# Patient Record
Sex: Male | Born: 1955 | Race: White | Hispanic: No | Marital: Married | Smoking: Current every day smoker
Health system: Southern US, Community
[De-identification: ages and names within clinical notes are randomized; demographics above are authoritative.]

## PROBLEM LIST (undated history)

## (undated) DIAGNOSIS — R918 Other nonspecific abnormal finding of lung field: Secondary | ICD-10-CM

## (undated) DIAGNOSIS — J449 Chronic obstructive pulmonary disease, unspecified: Secondary | ICD-10-CM

## (undated) DIAGNOSIS — J439 Emphysema, unspecified: Secondary | ICD-10-CM

## (undated) HISTORY — DX: Other nonspecific abnormal finding of lung field: R91.8

## (undated) HISTORY — DX: Chronic obstructive pulmonary disease, unspecified: J44.9

## (undated) HISTORY — DX: Emphysema, unspecified: J43.9

---

## 1991-11-04 HISTORY — PX: HERNIA REPAIR: SHX51

## 2012-09-08 LAB — PULMONARY FUNCTION TEST

## 2014-04-18 DIAGNOSIS — R918 Other nonspecific abnormal finding of lung field: Secondary | ICD-10-CM | POA: Insufficient documentation

## 2014-05-23 ENCOUNTER — Encounter: Payer: Self-pay | Admitting: *Deleted

## 2014-05-23 DIAGNOSIS — R918 Other nonspecific abnormal finding of lung field: Secondary | ICD-10-CM

## 2014-05-23 DIAGNOSIS — G4710 Hypersomnia, unspecified: Secondary | ICD-10-CM

## 2014-05-23 DIAGNOSIS — J449 Chronic obstructive pulmonary disease, unspecified: Secondary | ICD-10-CM | POA: Insufficient documentation

## 2014-05-23 DIAGNOSIS — G4730 Sleep apnea, unspecified: Secondary | ICD-10-CM

## 2014-05-23 DIAGNOSIS — J441 Chronic obstructive pulmonary disease with (acute) exacerbation: Secondary | ICD-10-CM

## 2014-05-23 DIAGNOSIS — J439 Emphysema, unspecified: Secondary | ICD-10-CM | POA: Insufficient documentation

## 2014-05-23 DIAGNOSIS — F17200 Nicotine dependence, unspecified, uncomplicated: Secondary | ICD-10-CM | POA: Insufficient documentation

## 2014-05-23 DIAGNOSIS — F419 Anxiety disorder, unspecified: Secondary | ICD-10-CM | POA: Insufficient documentation

## 2014-05-23 DIAGNOSIS — R0600 Dyspnea, unspecified: Secondary | ICD-10-CM | POA: Insufficient documentation

## 2014-05-23 DIAGNOSIS — J310 Chronic rhinitis: Secondary | ICD-10-CM | POA: Insufficient documentation

## 2014-05-24 ENCOUNTER — Encounter: Payer: 59 | Admitting: Thoracic Surgery (Cardiothoracic Vascular Surgery)

## 2014-05-26 ENCOUNTER — Encounter: Payer: 59 | Admitting: Thoracic Surgery (Cardiothoracic Vascular Surgery)

## 2014-06-06 ENCOUNTER — Other Ambulatory Visit: Payer: Self-pay | Admitting: *Deleted

## 2014-06-06 ENCOUNTER — Encounter: Payer: Self-pay | Admitting: Thoracic Surgery (Cardiothoracic Vascular Surgery)

## 2014-06-06 ENCOUNTER — Institutional Professional Consult (permissible substitution) (INDEPENDENT_AMBULATORY_CARE_PROVIDER_SITE_OTHER): Payer: 59 | Admitting: Thoracic Surgery (Cardiothoracic Vascular Surgery)

## 2014-06-06 VITALS — BP 198/94 | HR 80 | Ht 71.0 in | Wt 167.0 lb

## 2014-06-06 DIAGNOSIS — R918 Other nonspecific abnormal finding of lung field: Secondary | ICD-10-CM

## 2014-06-06 DIAGNOSIS — R222 Localized swelling, mass and lump, trunk: Secondary | ICD-10-CM

## 2014-06-06 NOTE — Progress Notes (Signed)
PCP is Renae Fickle, MD Referring Provider is Marcellus Scott, MD  Chief Complaint  Patient presents with  . New Thoracic    Lung Mass/Surg Consult    HPI:57 yo smoker sent for evaluation of left apical lung mass/ consolidation  Aaron Schroeder is a 58 year old man with a 90-pack-year history of smoking (2-3 packs per day since age 33), COPD, and hypertension. Back in January he was ill. He was more short of breath and felt very weak and had general malaise. He had a chest x-ray at that time which showed "pneumonia." He was treated with antibiotics (Zithromax) and his symptoms improved. He says that he did fine through this spring, however in June when it got hot he started having trouble breathing. He says this happens every year during the summer. He went back for a 3 month checkup. A CT of the chest was done which showed an opacity at the left apex. A PET CT then was done in mid June. It confirmed the mass at the left apex, there was mild hypermetabolic activity within SUV of 3.3. There was no hilar or mediastinal adenopathy. He essentially had a repeat CT in mid July, which showed no change in the mass/consolidation. There was no hilar or mediastinal adenopathy.  He says that he has cough every day. Mucous is typically clear. He hasn't had any gross hemoptysis but thinks he made seen some small specks of blood sometimes after his been coughing very hard. He says he is short of breath when he wakes up in the morning. He has trouble with the heat. He does have frequent wheezing. He says he is able to climb a flight of stairs but has to do so slowly. In general he is okay with walking on level ground was he has to walk quickly. He has not had any recent fevers chills or sweats. He's lost about 5 pounds over the past 6 months and about 3 pounds over the past 3 months. He denies any chest pain, pressure, or tightness.  ECOG/ZUBROD= 1   Past Medical History  Diagnosis Date  . COPD (chronic obstructive  pulmonary disease)   . Emphysema of lung   . Lung mass     Past Surgical History  Procedure Laterality Date  . Hernia repair  1993    Family History  Problem Relation Age of Onset  . Cancer Maternal Aunt     breast  . Cancer Paternal Uncle     prostate  . Cancer Paternal Grandfather     prostate    Social History History  Substance Use Topics  . Smoking status: Current Every Day Smoker -- 3.00 packs/day for 30 years    Types: Cigarettes    Start date: 05/23/1973  . Smokeless tobacco: Not on file     Comment: down to 1 ppd x 6 months  . Alcohol Use: Yes     Comment: "moderate"    Current Outpatient Prescriptions  Medication Sig Dispense Refill  . albuterol (PROVENTIL HFA;VENTOLIN HFA) 108 (90 BASE) MCG/ACT inhaler Inhale 2 puffs into the lungs every 6 (six) hours as needed for wheezing or shortness of breath.      . ALPRAZolam (XANAX) 0.5 MG tablet Take 0.5 mg by mouth 3 (three) times daily.      Marland Kitchen aspirin EC 81 MG tablet Take 81 mg by mouth daily.      . Fluticasone-Salmeterol (ADVAIR) 500-50 MCG/DOSE AEPB Inhale 1 puff into the lungs 2 (two) times daily.      Marland Kitchen  ipratropium-albuterol (DUONEB) 0.5-2.5 (3) MG/3ML SOLN Take 3 mLs by nebulization every 6 (six) hours as needed.      . metoprolol succinate (TOPROL-XL) 50 MG 24 hr tablet Take 50 mg by mouth daily. Take with or immediately following a meal.      . mometasone (NASONEX) 50 MCG/ACT nasal spray Place 2 sprays into the nose at bedtime.      . Olopatadine HCl (PATANASE) 0.6 % SOLN Place 2 drops into the nose daily. In each nares      . omeprazole (PRILOSEC) 40 MG capsule Take 40 mg by mouth daily.      . predniSONE (DELTASONE) 10 MG tablet Take 10 mg by mouth daily with breakfast.      . tiotropium (SPIRIVA) 18 MCG inhalation capsule Place 18 mcg into inhaler and inhale daily.       No current facility-administered medications for this visit.    Allergies  Allergen Reactions  . Codeine Other (See Comments)    Pt  states he feels ill after taking this.    Review of Systems  Constitutional: Positive for activity change and unexpected weight change (lost 5 pounds in 6 months, 3 pounds in 3 months). Negative for fever and chills.  Respiratory: Positive for cough (productive- clear, ? small amounts of blood a few weeks ago), shortness of breath and wheezing.   Cardiovascular: Negative for chest pain and leg swelling.       Heart murmur  Musculoskeletal: Positive for myalgias.       Leg cramps  Neurological: Negative.   Hematological: Negative for adenopathy. Bruises/bleeds easily.  Psychiatric/Behavioral: The patient is nervous/anxious.   All other systems reviewed and are negative.   BP 198/94  Pulse 80  Ht 5\' 11"  (1.803 m)  Wt 167 lb (75.751 kg)  BMI 23.30 kg/m2  SpO2 98% Physical Exam  Vitals reviewed. Constitutional: He is oriented to person, place, and time. He appears well-developed and well-nourished. No distress.  HENT:  Head: Normocephalic and atraumatic.  Eyes: EOM are normal. Pupils are equal, round, and reactive to light.  Neck: Neck supple. No thyromegaly present.  + right carotid bruit  Cardiovascular:  Murmur (3/6 high pitched systolic murmur at apex) heard. Pulmonary/Chest: Effort normal. No respiratory distress. He has wheezes (faint bilaterally).  Abdominal: Soft. There is no tenderness.  Musculoskeletal: He exhibits no edema.  Lymphadenopathy:    He has no cervical adenopathy.  Neurological: He is alert and oriented to person, place, and time. No cranial nerve deficit.  Skin: Skin is warm and dry.     Diagnostic Tests:   Impression: 58 year old gentleman with a long history of tobacco abuse and COPD who has a left apical opacity. A PET CT about 6 weeks ago showed mild hypermetabolic activity within SUV of 3.3. A followup CT scan showed no change in the left apical abnormality. There is no hilar or mediastinal adenopathy.  Had a long discussion with Mr. and Mrs.  Schroeder regarding the differential diagnosis. We reviewed the films. Looking at it carefully there is an opacity in the setting of surrounding bullous emphysematous disease. There are air bronchograms within the opacity. Given his clinical history and the appearance of the CT scan this, I suspect this is an area of infection/inflammation, possibly BOOP. I cannot rule out the possibility that it is a malignancy, but I do think that is less likely.  We also discussed the possible options for workup. 1. Continued radiographic followup 2. CT-guided biopsy-I don't think this  is feasible given the surrounding bullous emphysematous disease 3. Navigational bronchoscopy for biopsy-while feasible I think it is still relatively high risk of pneumothorax. 4. Surgical resection-follow this would definitively diagnose and, potentially, definitively treat the lesion he would have significant surgical risk. He would need an echocardiogram due to his very prominent heart murmur. And he would also need carotid duplex do a loud high-pitched bruit on the right side prior to surgery.  After lengthy discussion, Aaron Schroeder wishes to continue to follow this radiographically. I will plan to see him back in 6 weeks with repeat CT of the chest (that'll be 8 weeks from the previous scan)   Plan: Return in 6 weeks CT chest

## 2014-07-17 ENCOUNTER — Encounter: Payer: Self-pay | Admitting: *Deleted

## 2014-07-18 ENCOUNTER — Other Ambulatory Visit: Payer: Medicare Other

## 2014-07-18 ENCOUNTER — Ambulatory Visit: Payer: 59 | Admitting: Thoracic Surgery (Cardiothoracic Vascular Surgery)

## 2014-07-25 ENCOUNTER — Encounter: Payer: Self-pay | Admitting: Thoracic Surgery (Cardiothoracic Vascular Surgery)

## 2014-07-25 ENCOUNTER — Ambulatory Visit (INDEPENDENT_AMBULATORY_CARE_PROVIDER_SITE_OTHER): Payer: 59 | Admitting: Thoracic Surgery (Cardiothoracic Vascular Surgery)

## 2014-07-25 ENCOUNTER — Ambulatory Visit
Admission: RE | Admit: 2014-07-25 | Discharge: 2014-07-25 | Disposition: A | Discharge status: Home / Self Care | Payer: 59 | Source: Ambulatory Visit | Attending: Thoracic Surgery (Cardiothoracic Vascular Surgery) | Admitting: Thoracic Surgery (Cardiothoracic Vascular Surgery)

## 2014-07-25 VITALS — BP 139/77 | HR 81 | Ht 71.0 in | Wt 167.0 lb

## 2014-07-25 DIAGNOSIS — R918 Other nonspecific abnormal finding of lung field: Secondary | ICD-10-CM

## 2014-07-25 DIAGNOSIS — R222 Localized swelling, mass and lump, trunk: Secondary | ICD-10-CM

## 2014-07-25 NOTE — Progress Notes (Signed)
HPI:  Aaron Schroeder returns today to followup regarding his left apical abnormality.  Aaron Schroeder is a 58 year old man with a 90-pack-year history of smoking (2-3 packs per day since age 18), COPD, and hypertension. Back in January he was ill. He was more short of breath and felt very weak and had general malaise. He had a chest x-ray at that time which showed "pneumonia." He was treated with antibiotics (Zithromax) and his symptoms improved. He says that he did fine through this spring, however in June when it got hot he started having trouble breathing. He says this happens every year during the summer. He went back for a 3 month checkup. A CT of the chest was done which showed an opacity at the left apex. A PET CT then was done in mid June. It confirmed the mass at the left apex, there was mild hypermetabolic activity within SUV of 3.3. There was no hilar or mediastinal adenopathy. He essentially had a repeat CT in mid July, which showed no change in the mass/consolidation. There was no hilar or mediastinal adenopathy.   After discussion of our options including continued radiographic followup, bronchoscopy, CT-guided biopsy, and surgical resection, Aaron Schroeder opted to follow this radiographically.  He continues to smoke. He says his been using Vapor cigarettes mostly but is still smoking a regular cigarettes as well. He has a chronic cough which is unchanged. He does still have episodes of wheezing. His weight has been stable since his last visit.  He mentioned almost as an aside that he's been having some chest pain. He says this usually happens when he is anxious about something or when he is exerting himself. H thinks it is just heartburn. He describes this as a substernal burning pain. He has not had any at rest or causing him to wake up in the night.  Past Medical History  Diagnosis Date  . COPD (chronic obstructive pulmonary disease)   . Emphysema of lung   . Lung mass      Current Outpatient  Prescriptions  Medication Sig Dispense Refill  . albuterol (PROVENTIL HFA;VENTOLIN HFA) 108 (90 BASE) MCG/ACT inhaler Inhale 2 puffs into the lungs every 6 (six) hours as needed for wheezing or shortness of breath.      Marland Kitchen aspirin EC 81 MG tablet Take 81 mg by mouth daily.      . Fluticasone-Salmeterol (ADVAIR) 500-50 MCG/DOSE AEPB Inhale 1 puff into the lungs 2 (two) times daily.      Marland Kitchen ipratropium-albuterol (DUONEB) 0.5-2.5 (3) MG/3ML SOLN Take 3 mLs by nebulization every 6 (six) hours as needed.      . metoprolol succinate (TOPROL-XL) 50 MG 24 hr tablet Take 50 mg by mouth daily. Take with or immediately following a meal.      . mometasone (NASONEX) 50 MCG/ACT nasal spray Place 2 sprays into the nose at bedtime.      . Olopatadine HCl (PATANASE) 0.6 % SOLN Place 2 drops into the nose daily. In each nares      . omeprazole (PRILOSEC) 40 MG capsule Take 40 mg by mouth daily.      . predniSONE (DELTASONE) 10 MG tablet Take 10 mg by mouth daily with breakfast.      . tiotropium (SPIRIVA) 18 MCG inhalation capsule Place 18 mcg into inhaler and inhale daily.      Marland Kitchen ALPRAZolam (XANAX) 0.5 MG tablet Take 0.5 mg by mouth 3 (three) times daily.       No current facility-administered  medications for this visit.    Physical Exam BP 139/77  Pulse 81  Ht  (1.803 m)  Wt 167 lb (75.751 kg)  BMI 23.30 kg/m2  SpO62 99% 58 year old man in no acute distress No cervical or subclavicular adenopathy Cardiac regular rate and rhythm normal S1 and S2 Lungs diminished breath sounds bilaterally, no wheezing  Diagnostic Tests: CT CHEST WITHOUT CONTRAST  TECHNIQUE:  Multidetector CT imaging of the chest was performed following the  standard protocol without IV contrast.  COMPARISON: Chest CT 05/16/2014.  FINDINGS:  Mediastinum: Heart size is normal. There is no significant  pericardial fluid, thickening or pericardial calcification. There is  atherosclerosis of the thoracic aorta, the great vessels of  the  mediastinum and the coronary arteries, including calcified  atherosclerotic plaque in the left main, left anterior descending,  left circumflex and right coronary arteries. Extensive  calcifications of the aortic valve. No pathologically enlarged  mediastinal or hilar lymph nodes. Please note that accurate  exclusion of hilar adenopathy is limited on noncontrast CT scans.  Esophagus is unremarkable in appearance.  Lungs/Pleura: Again noted is a somewhat masslike appearing area of  chronic left apical pleuroparenchymal thickening and architectural  distortion which is similar to prior exams dating back to  04/10/2014, most compatible with an area of chronic post infectious  or inflammatory scarring. In the remainder of the lungs there are no  definite suspicious appearing pulmonary nodules or masses. No acute  consolidative airspace disease. No pleural effusions. There is  extensive chronic pleural thickening bilaterally, with bandlike  areas of atelectasis in the posterior aspects of the lower lobes of  the lungs bilaterally which are unchanged. Moderate paraseptal and  mild centrilobular emphysema. Mild diffuse bronchial wall  thickening.  Upper Abdomen: Unremarkable.  Musculoskeletal: There are no aggressive appearing lytic or blastic  lesions noted in the visualized portions of the skeleton.  IMPRESSION:  1. Stable examination again demonstrating chronic masslike  pleuroparenchymal thickening in the left apex which is most  compatible with an area of chronic post infectious or inflammatory  scarring.  2. Mild diffuse bronchial wall thickening with moderate paraseptal  and mild centrilobular emphysema.  3. Atherosclerosis, including left main and 3 vessel coronary artery  disease. Please note that although the presence of coronary artery  calcium documents the presence of coronary artery disease, the  severity of this disease and any potential stenosis cannot be  assessed on  this non-gated CT examination. Assessment for potential  risk factor modification, dietary therapy or pharmacologic therapy  may be warranted, if clinically indicated.  4. Extensive bilateral posterior pleural thickening and bandlike  areas of chronic atelectasis in the a lower lobes of the lungs  bilaterally.  Electronically Signed  By: Trudie Reed M.D.  On: 07/25/2014 15:27    Impression: Mr. Aaron Schroeder is a 57 year old gentleman with a history of heavy tobacco abuse who has an unusual opacity in his left apex. This was mildly hypermetabolic on PET CT back in June. It has not changed in the interim over the past 3 months. Given the stability in addition to the high risk nature of attempting to biopsy this lesion, I recommended continued radiographic followup. He is in agreement with that plan.  Of more immediate concern is his substernal chest pain. His CT scans do show extensive atherosclerosis in his coronary arteries. His symptoms are consistent with angina. I told in no uncertain terms talk with Dr. Samuel Germany about this to be evaluated for possible coronary disease.  I again counseled him regarding smoking cessation and its importance from both the pulmonary and cardiac perspective  Plan: Patient will talk to Dr. Samuel Germany about his chest pain to see if cardiology consultation is appropriate  I will see him back in 3 months with a repeat CT of the chest

## 2014-09-11 ENCOUNTER — Other Ambulatory Visit: Payer: Self-pay | Admitting: *Deleted

## 2014-09-11 DIAGNOSIS — R918 Other nonspecific abnormal finding of lung field: Secondary | ICD-10-CM

## 2014-10-18 ENCOUNTER — Encounter: Payer: Self-pay | Admitting: *Deleted

## 2014-10-24 ENCOUNTER — Encounter: Payer: Self-pay | Admitting: Thoracic Surgery (Cardiothoracic Vascular Surgery)

## 2014-10-24 ENCOUNTER — Ambulatory Visit (INDEPENDENT_AMBULATORY_CARE_PROVIDER_SITE_OTHER): Payer: 59 | Admitting: Thoracic Surgery (Cardiothoracic Vascular Surgery)

## 2014-10-24 ENCOUNTER — Ambulatory Visit
Admission: RE | Admit: 2014-10-24 | Discharge: 2014-10-24 | Disposition: A | Discharge status: Home / Self Care | Payer: 59 | Source: Ambulatory Visit | Attending: Thoracic Surgery (Cardiothoracic Vascular Surgery) | Admitting: Thoracic Surgery (Cardiothoracic Vascular Surgery)

## 2014-10-24 VITALS — BP 150/80 | HR 80 | Resp 20 | Ht 71.0 in | Wt 168.0 lb

## 2014-10-24 DIAGNOSIS — R918 Other nonspecific abnormal finding of lung field: Secondary | ICD-10-CM

## 2014-10-24 NOTE — Progress Notes (Signed)
HPI:  [name] is a 58 yo man with a history of heavy tobacco abuse and COPD who we have been following for a "mass" in the left apex.  Mr. Aaron Schroeder is a 58 year old man with a 90-pack-year history of smoking (2-3 packs per day since age 58), COPD, and hypertension. He had pneumonia in January. Then in June when it got hot he started having trouble breathing. He says it happens every year during the summer. He went back for a 3 month checkup. A CT of the chest was done which showed an opacity at the left apex. A PET CT then was done in mid June. It confirmed the mass at the left apex, there was mild hypermetabolic activity within SUV of 3.3. There was no hilar or mediastinal adenopathy. He eventually had a repeat CT in mid July, which showed no change in the mass/consolidation. There was no hilar or mediastinal adenopathy.   I saw him in August. We discussed the options including continued radiographic followup, bronchoscopy, CT-guided biopsy, and surgical resection. Mr. Aaron Schroeder opted to follow this radiographically. I saw him again in September for a follow up visit. There was no change at that time.  He now returns for a 3 month follow up. He says his breathing has been "fine", except for a risk for infection last week which was treated with antibiotics. He continues to smoke. He is smoking primarily regular cigarettes rather than the vapor cigarettes. He says he smoking about a half a pack a day. His chronic cough is unchanged. He has not had any hemoptysis. He still has wheezing. At his wife says his lost between 7 and 10 pounds over the past several months. He denies any chest pain since his last visit.  Past Medical History  Diagnosis Date  . COPD (chronic obstructive pulmonary disease)   . Emphysema of lung   . Lung mass       Current Outpatient Prescriptions  Medication Sig Dispense Refill  . albuterol (PROVENTIL HFA;VENTOLIN HFA) 108 (90 BASE) MCG/ACT inhaler Inhale 2 puffs into the lungs  every 6 (six) hours as needed for wheezing or shortness of breath.    . ALPRAZolam (XANAX) 0.5 MG tablet Take 0.5 mg by mouth 3 (three) times daily.    Marland Kitchen. amoxicillin-clavulanate (AUGMENTIN) 875-125 MG per tablet Take 1 tablet by mouth 2 (two) times daily.     Marland Kitchen. aspirin EC 81 MG tablet Take 81 mg by mouth daily.    . Fluticasone-Salmeterol (ADVAIR) 500-50 MCG/DOSE AEPB Inhale 1 puff into the lungs 2 (two) times daily.    Marland Kitchen. ipratropium-albuterol (DUONEB) 0.5-2.5 (3) MG/3ML SOLN Take 3 mLs by nebulization every 6 (six) hours as needed.    . metoprolol succinate (TOPROL-XL) 50 MG 24 hr tablet Take 50 mg by mouth daily. Take with or immediately following a meal.    . mometasone (NASONEX) 50 MCG/ACT nasal spray Place 2 sprays into the nose at bedtime.    . Olopatadine HCl (PATANASE) 0.6 % SOLN Place 2 drops into the nose daily. In each nares    . omeprazole (PRILOSEC) 40 MG capsule Take 40 mg by mouth daily.    . predniSONE (DELTASONE) 10 MG tablet Take 10 mg by mouth daily with breakfast.    . tiotropium (SPIRIVA) 18 MCG inhalation capsule Place 18 mcg into inhaler and inhale daily.     No current facility-administered medications for this visit.    Physical Exam BP 150/80 mmHg  Pulse 80  Resp 20  Ht 5\' 11"  (1.803 m)  Wt 168 lb (76.204 kg)  BMI 23.44 kg/m2  SpO762 3598% 58 year old male in no acute distress Well-developed well-nourished Smell of tobacco on clothing Lungs with mild wheezing bilaterally No palpable cervical or subclavicular adenopathy Cardiac regular rate and rhythm with a 2/6 systolic murmur  Diagnostic Tests: CT CHEST WITHOUT CONTRAST  TECHNIQUE: Multidetector CT imaging of the chest was performed following the standard protocol without IV contrast.  COMPARISON: CT chest dated 07/25/2014 and 05/16/2014. PET-CT dated 04/18/2014.  FINDINGS: Moderate centrilobular and paraseptal emphysematous changes, most severe at the bilateral lung apices.  Stable left apical  mass-like opacity (series 4/ image 13), unchanged on recent prior studies (since at least 04/10/2014) although new/progressed from 2014, with mild/low grade hypermetabolism on PET. This appearance remains most compatible with left apical pleural parenchymal scarring, likely post infectious/inflammatory.  No new/suspicious pulmonary nodules. Mild scarring/ atelectasis at the lung bases. Trace bilateral pleural effusions. No pneumothorax.  Visualized thyroid is unremarkable.  The heart is normal in size. No pericardial effusion. Left main and three vessel coronary atherosclerosis. Atherosclerotic calcifications of the aortic arch. Ectasia of the ascending thoracic aorta, measuring 3.9 cm.  Small mediastinal lymph nodes which do not meet pathologic CT size criteria. No suspicious axillary lymphadenopathy.  Visualized upper abdomen is notable for vascular calcifications.  Mild degenerative changes of the visualized thoracolumbar spine.  IMPRESSION: Stable left apical mass-like opacity, unchanged since June 2015, likely reflecting post infectious/inflammatory pleural parenchymal scarring.  Underlying moderate emphysematous changes.   Electronically Signed  By: Charline BillsSriyesh Krishnan M.D.  On: 10/24/2014 12:55  Impression: 58 year old man with a left apical opacity/mass on CT scan. This is unchanged since his last CT back in September, which itself was unchanged from his CT in June. This most likely is an area of post inflammatory scarring or chronic inflammatory process such as BOOP. I reviewed the CT scan with him and his wife. They understand that there is no way that we can definitively rule out the possibility of cancer in that area.  I once again reviewed the options of surgical resection, biopsy, and continued radiographic follow-up. We again discussed the relative advantages and disadvantages of each of those approaches. He wishes to continue to follow this  radiographically.  I once again counseled him on the importance of smoking cessation. He says he is cutting back gradually, and does not need any assistance.  Plan: Return in 3 months with CT of chest

## 2015-01-11 ENCOUNTER — Other Ambulatory Visit: Payer: Self-pay

## 2015-01-11 DIAGNOSIS — R911 Solitary pulmonary nodule: Secondary | ICD-10-CM

## 2015-02-20 ENCOUNTER — Ambulatory Visit
Admission: RE | Admit: 2015-02-20 | Discharge: 2015-02-20 | Disposition: A | Discharge status: Home / Self Care | Payer: Medicare PPO | Source: Ambulatory Visit | Attending: Thoracic Surgery (Cardiothoracic Vascular Surgery) | Admitting: Thoracic Surgery (Cardiothoracic Vascular Surgery)

## 2015-02-20 ENCOUNTER — Encounter: Payer: Self-pay | Admitting: Thoracic Surgery (Cardiothoracic Vascular Surgery)

## 2015-02-20 ENCOUNTER — Ambulatory Visit (INDEPENDENT_AMBULATORY_CARE_PROVIDER_SITE_OTHER): Payer: Medicare PPO | Admitting: Thoracic Surgery (Cardiothoracic Vascular Surgery)

## 2015-02-20 VITALS — BP 141/72 | HR 94 | Resp 16 | Ht 71.0 in | Wt 168.0 lb

## 2015-02-20 DIAGNOSIS — R918 Other nonspecific abnormal finding of lung field: Secondary | ICD-10-CM | POA: Diagnosis not present

## 2015-02-20 DIAGNOSIS — R911 Solitary pulmonary nodule: Secondary | ICD-10-CM

## 2015-02-20 MED ORDER — AMOXICILLIN-POT CLAVULANATE 875-125 MG PO TABS: 1.0000 | ORAL_TABLET | Freq: Two times a day (BID) | ORAL | Status: AC

## 2015-02-20 NOTE — Progress Notes (Signed)
[address]       Aaron Schroeder [id number]             [phone]       HPI: Mr. Aaron Schroeder returns for a scheduled 3 month follow-up visit.  Mr Schroeder is a 59 yo man with a history of heavy tobacco abuse and COPD who I have been following since August 2015 for a "mass" in his left apex.  He has a 90-pack-year history of smoking (2-3 packs per day since age 32), COPD, and hypertension. He says he is currently down to about one half pack per day, but that is what he told me that his last visit as well.  He had pneumonia in January 2015. In June 2015 he started having trouble breathing. He went back for a 3 month checkup. A CT of the chest was done which showed an opacity at the left apex. A PET CT  was done in mid June. It confirmed the mass at the left apex, there was mild hypermetabolic activity with an SUV of 3.3. There was no hilar or mediastinal adenopathy. He eventually had a repeat CT in mid July, which showed no change in the mass/consolidation. There was no hilar or mediastinal adenopathy.   I first saw him in August 2015. We discussed the options including continued radiographic followup, bronchoscopy, CT-guided biopsy, and surgical resection. Mr. [name] opted to follow this radiographically. I saw him again in September and again in December. The radiographic findings were stable.  In the interim since his last visit he says been having more trouble with shortness of breath. As noted he continues to smoke. He complains of general malaise. He has had any fevers, chills, or sweats. However he has had increasingly productive cough with greenish sputum. He denies any hemoptysis.  Past Medical History  Diagnosis Date  . COPD (chronic obstructive pulmonary disease)   . Emphysema of lung   . Lung mass       Current Outpatient Prescriptions  Medication Sig Dispense Refill  . albuterol (PROVENTIL HFA;VENTOLIN HFA) 108 (90 BASE) MCG/ACT inhaler Inhale 2 puffs into the lungs  every 6 (six) hours as needed for wheezing or shortness of breath.    . ALPRAZolam (XANAX) 0.5 MG tablet Take 0.5 mg by mouth 3 (three) times daily.    Marland Kitchen aspirin EC 81 MG tablet Take 81 mg by mouth daily.    . Fluticasone-Salmeterol (ADVAIR) 500-50 MCG/DOSE AEPB Inhale 1 puff into the lungs 2 (two) times daily.    Marland Kitchen ipratropium-albuterol (DUONEB) 0.5-2.5 (3) MG/3ML SOLN Take 3 mLs by nebulization every 6 (six) hours as needed.    . metoprolol succinate (TOPROL-XL) 50 MG 24 hr tablet Take 50 mg by mouth daily. Take with or immediately following a meal.    . mometasone (NASONEX) 50 MCG/ACT nasal spray Place 2 sprays into the nose at bedtime.    . Olopatadine HCl (PATANASE) 0.6 % SOLN Place 2 drops into the nose daily. In each nares    . omeprazole (PRILOSEC) 40 MG capsule Take 40 mg by mouth daily.    . predniSONE (DELTASONE) 10 MG tablet Take 10 mg by mouth daily with breakfast.    . amoxicillin-clavulanate (AUGMENTIN) 875-125 MG per tablet Take 1 tablet by mouth 2 (two) times daily. 20 tablet 0   No current facility-administered medications for this visit.    Physical Exam BP 141/72 mmHg  Pulse 94  Resp 16  Ht  (  1.803 m)  Wt 168 lb (76.204 kg)  BMI 23.44 kg/m2  SpO12 10595% 59 year old man in no acute distress Obvious tobacco odor Alert and oriented 3 with no focal deficits No cervical or subclavicular adenopathy Lungs with diminished breath sounds bilaterally, no wheezing Cardiac regular rate and rhythm normal S1 and S2  Diagnostic Tests: CT CHEST WITHOUT CONTRAST  TECHNIQUE: Multidetector CT imaging of the chest was performed following the standard protocol without IV contrast.  COMPARISON: 10/24/2014, 07/25/2014, PET-CT 04/18/2014  FINDINGS: Mediastinum/Nodes: 4.0 cm ascending aortic dilatation image 34. Atheromatous coronary arterial and aortic plaque. No hilar or mediastinal lymphadenopathy. Heart size is normal.  Lungs/Pleura: Interval development of  cavitation with internal air-fluid levels within the previously seen left apical irregular consolidation with internal air bronchogram formation and central bronchial wall thickening. Curvilinear bibasilar scarring is stable. Underlying emphysematous changes reidentified. A few areas of patchy tree-in-bud type nodular airspace opacity have developed elsewhere within the left upper lobe and lingula, but the largest representative individual nodule measuring 6 mm image 34. No new focal finding is identified in the right lung.  Upper abdomen: Adrenal glands appear normal in their visualized aspects. Atheromatous aortic and branch vessel calcification.  Musculoskeletal: No acute osseous abnormality. Bones are subjectively osteopenic.  IMPRESSION: Interval development of cavitation with internal air-fluid levels within the previously seen left upper lobe apical pulmonary parenchymal consolidation. Areas of central bronchial wall thickening and tree-in-bud type nodular airspace disease in the left upper lobe and lingula are compatible overall with small airways infection and superimposed left upper lobe infection is most likely rather than cavitary neoplasm.  Underlying emphysema.  These results will be called to the ordering clinician or representative by the Radiologist Assistant, and communication documented in the PACS or zVision Dashboard.   Electronically Signed  By: Christiana PellantGretchen Green M.D.  On: 02/20/2015 12:59  Impression: 59 year old man with tobacco addiction, COPD with emphysema, and a complex cavitary left upper lobe mass. He has been feeling worse recently with increasing shortness of breath and increasing productive cough with greenish sputum. He likely has pneumonia. I am going to start him on Augmentin 875 mg twice a day for 10 days.  I reviewed the CT findings and images with Mr. and Mrs. [name]. They understand there is no way we can rule out cancer, just that  the films are more suspicious for infection.  I also once again pointed out to them that he has a 4.0 cm ascending aortic aneurysm that will need continued follow-up. I emphasized the importance of smoking cessation from a vascular and cardiac disease standpoint as well as a pulmonary disease standpoint.  Smoking cessation instruction/counseling given:  counseled patient on the dangers of tobacco use, advised patient to stop smoking, and reviewed strategies to maximize success. He refused offers for smoking cessation medications saying he has had adverse effects from Chantix previously. He seems reluctant to consider quitting.  Plan: Augmentin 850 mg by mouth 3 times a day for 10 days  I'll plan to see him back in 6 months check on his progress. We will repeat his CT at that time  Loreli SlotSteven C Shoji Pertuit, MD [facility] [phone]

## 2015-04-30 DIAGNOSIS — R05 Cough: Secondary | ICD-10-CM | POA: Diagnosis not present

## 2015-04-30 DIAGNOSIS — J310 Chronic rhinitis: Secondary | ICD-10-CM | POA: Diagnosis not present

## 2015-04-30 DIAGNOSIS — G4733 Obstructive sleep apnea (adult) (pediatric): Secondary | ICD-10-CM | POA: Diagnosis not present

## 2015-04-30 DIAGNOSIS — F17210 Nicotine dependence, cigarettes, uncomplicated: Secondary | ICD-10-CM | POA: Diagnosis not present

## 2015-04-30 DIAGNOSIS — J449 Chronic obstructive pulmonary disease, unspecified: Secondary | ICD-10-CM | POA: Diagnosis not present

## 2015-05-08 DIAGNOSIS — J449 Chronic obstructive pulmonary disease, unspecified: Secondary | ICD-10-CM | POA: Diagnosis not present

## 2015-05-08 DIAGNOSIS — R918 Other nonspecific abnormal finding of lung field: Secondary | ICD-10-CM | POA: Diagnosis not present

## 2015-05-08 DIAGNOSIS — F17210 Nicotine dependence, cigarettes, uncomplicated: Secondary | ICD-10-CM | POA: Diagnosis not present

## 2015-05-08 DIAGNOSIS — J310 Chronic rhinitis: Secondary | ICD-10-CM | POA: Diagnosis not present

## 2015-05-29 DIAGNOSIS — G4733 Obstructive sleep apnea (adult) (pediatric): Secondary | ICD-10-CM | POA: Diagnosis not present

## 2015-05-29 DIAGNOSIS — R0600 Dyspnea, unspecified: Secondary | ICD-10-CM | POA: Diagnosis not present

## 2015-05-29 DIAGNOSIS — R5383 Other fatigue: Secondary | ICD-10-CM | POA: Diagnosis not present

## 2015-05-29 DIAGNOSIS — F17210 Nicotine dependence, cigarettes, uncomplicated: Secondary | ICD-10-CM | POA: Diagnosis not present

## 2015-05-30 DIAGNOSIS — R05 Cough: Secondary | ICD-10-CM | POA: Diagnosis not present

## 2015-06-06 DIAGNOSIS — F411 Generalized anxiety disorder: Secondary | ICD-10-CM | POA: Diagnosis not present

## 2015-06-06 DIAGNOSIS — F17210 Nicotine dependence, cigarettes, uncomplicated: Secondary | ICD-10-CM | POA: Diagnosis not present

## 2015-06-06 DIAGNOSIS — R918 Other nonspecific abnormal finding of lung field: Secondary | ICD-10-CM | POA: Diagnosis not present

## 2015-06-06 DIAGNOSIS — J449 Chronic obstructive pulmonary disease, unspecified: Secondary | ICD-10-CM | POA: Diagnosis not present

## 2015-07-07 DIAGNOSIS — R911 Solitary pulmonary nodule: Secondary | ICD-10-CM | POA: Diagnosis not present

## 2015-07-07 DIAGNOSIS — J181 Lobar pneumonia, unspecified organism: Secondary | ICD-10-CM | POA: Diagnosis not present

## 2015-07-07 DIAGNOSIS — J189 Pneumonia, unspecified organism: Secondary | ICD-10-CM | POA: Diagnosis not present

## 2015-07-07 DIAGNOSIS — R0902 Hypoxemia: Secondary | ICD-10-CM | POA: Diagnosis not present

## 2015-07-08 DIAGNOSIS — Z66 Do not resuscitate: Secondary | ICD-10-CM | POA: Diagnosis not present

## 2015-07-08 DIAGNOSIS — R0601 Orthopnea: Secondary | ICD-10-CM | POA: Diagnosis not present

## 2015-07-08 DIAGNOSIS — J439 Emphysema, unspecified: Secondary | ICD-10-CM | POA: Diagnosis not present

## 2015-07-08 DIAGNOSIS — F1010 Alcohol abuse, uncomplicated: Secondary | ICD-10-CM | POA: Diagnosis not present

## 2015-07-08 DIAGNOSIS — A310 Pulmonary mycobacterial infection: Secondary | ICD-10-CM | POA: Diagnosis not present

## 2015-07-08 DIAGNOSIS — R918 Other nonspecific abnormal finding of lung field: Secondary | ICD-10-CM | POA: Diagnosis not present

## 2015-07-08 DIAGNOSIS — F17210 Nicotine dependence, cigarettes, uncomplicated: Secondary | ICD-10-CM | POA: Diagnosis not present

## 2015-07-08 DIAGNOSIS — R0602 Shortness of breath: Secondary | ICD-10-CM | POA: Diagnosis not present

## 2015-07-08 DIAGNOSIS — R911 Solitary pulmonary nodule: Secondary | ICD-10-CM | POA: Diagnosis not present

## 2015-07-08 DIAGNOSIS — J449 Chronic obstructive pulmonary disease, unspecified: Secondary | ICD-10-CM | POA: Diagnosis not present

## 2015-07-08 DIAGNOSIS — I501 Left ventricular failure: Secondary | ICD-10-CM | POA: Diagnosis not present

## 2015-07-08 DIAGNOSIS — R590 Localized enlarged lymph nodes: Secondary | ICD-10-CM | POA: Diagnosis not present

## 2015-07-08 DIAGNOSIS — M549 Dorsalgia, unspecified: Secondary | ICD-10-CM | POA: Diagnosis not present

## 2015-07-08 DIAGNOSIS — R634 Abnormal weight loss: Secondary | ICD-10-CM | POA: Diagnosis not present

## 2015-07-08 DIAGNOSIS — J441 Chronic obstructive pulmonary disease with (acute) exacerbation: Secondary | ICD-10-CM | POA: Diagnosis not present

## 2015-07-08 DIAGNOSIS — I10 Essential (primary) hypertension: Secondary | ICD-10-CM | POA: Diagnosis not present

## 2015-07-18 DIAGNOSIS — R0989 Other specified symptoms and signs involving the circulatory and respiratory systems: Secondary | ICD-10-CM | POA: Diagnosis not present

## 2015-07-18 DIAGNOSIS — Z7951 Long term (current) use of inhaled steroids: Secondary | ICD-10-CM | POA: Diagnosis not present

## 2015-07-18 DIAGNOSIS — Z79899 Other long term (current) drug therapy: Secondary | ICD-10-CM | POA: Diagnosis not present

## 2015-07-18 DIAGNOSIS — Z79891 Long term (current) use of opiate analgesic: Secondary | ICD-10-CM | POA: Diagnosis not present

## 2015-07-18 DIAGNOSIS — J449 Chronic obstructive pulmonary disease, unspecified: Secondary | ICD-10-CM | POA: Diagnosis not present

## 2015-07-18 DIAGNOSIS — M549 Dorsalgia, unspecified: Secondary | ICD-10-CM | POA: Diagnosis not present

## 2015-07-18 DIAGNOSIS — J984 Other disorders of lung: Secondary | ICD-10-CM | POA: Diagnosis not present

## 2015-07-18 DIAGNOSIS — F17210 Nicotine dependence, cigarettes, uncomplicated: Secondary | ICD-10-CM | POA: Diagnosis not present

## 2015-07-26 DIAGNOSIS — M546 Pain in thoracic spine: Secondary | ICD-10-CM | POA: Diagnosis not present

## 2015-07-26 DIAGNOSIS — M5136 Other intervertebral disc degeneration, lumbar region: Secondary | ICD-10-CM | POA: Diagnosis not present

## 2015-07-26 DIAGNOSIS — M5441 Lumbago with sciatica, right side: Secondary | ICD-10-CM | POA: Diagnosis not present

## 2015-07-26 DIAGNOSIS — M5137 Other intervertebral disc degeneration, lumbosacral region: Secondary | ICD-10-CM | POA: Diagnosis not present

## 2015-07-26 DIAGNOSIS — M62830 Muscle spasm of back: Secondary | ICD-10-CM | POA: Diagnosis not present

## 2015-07-27 DIAGNOSIS — M62830 Muscle spasm of back: Secondary | ICD-10-CM | POA: Diagnosis not present

## 2015-07-27 DIAGNOSIS — M5441 Lumbago with sciatica, right side: Secondary | ICD-10-CM | POA: Diagnosis not present

## 2015-07-27 DIAGNOSIS — M546 Pain in thoracic spine: Secondary | ICD-10-CM | POA: Diagnosis not present

## 2015-07-27 DIAGNOSIS — M5137 Other intervertebral disc degeneration, lumbosacral region: Secondary | ICD-10-CM | POA: Diagnosis not present

## 2015-07-27 DIAGNOSIS — M5136 Other intervertebral disc degeneration, lumbar region: Secondary | ICD-10-CM | POA: Diagnosis not present

## 2015-07-30 DIAGNOSIS — M62830 Muscle spasm of back: Secondary | ICD-10-CM | POA: Diagnosis not present

## 2015-07-30 DIAGNOSIS — M5441 Lumbago with sciatica, right side: Secondary | ICD-10-CM | POA: Diagnosis not present

## 2015-07-30 DIAGNOSIS — M546 Pain in thoracic spine: Secondary | ICD-10-CM | POA: Diagnosis not present

## 2015-07-30 DIAGNOSIS — M5137 Other intervertebral disc degeneration, lumbosacral region: Secondary | ICD-10-CM | POA: Diagnosis not present

## 2015-07-30 DIAGNOSIS — M5136 Other intervertebral disc degeneration, lumbar region: Secondary | ICD-10-CM | POA: Diagnosis not present

## 2015-08-01 DIAGNOSIS — M546 Pain in thoracic spine: Secondary | ICD-10-CM | POA: Diagnosis not present

## 2015-08-01 DIAGNOSIS — M5136 Other intervertebral disc degeneration, lumbar region: Secondary | ICD-10-CM | POA: Diagnosis not present

## 2015-08-01 DIAGNOSIS — M5137 Other intervertebral disc degeneration, lumbosacral region: Secondary | ICD-10-CM | POA: Diagnosis not present

## 2015-08-01 DIAGNOSIS — M5441 Lumbago with sciatica, right side: Secondary | ICD-10-CM | POA: Diagnosis not present

## 2015-08-01 DIAGNOSIS — M62830 Muscle spasm of back: Secondary | ICD-10-CM | POA: Diagnosis not present

## 2015-08-02 DIAGNOSIS — M5137 Other intervertebral disc degeneration, lumbosacral region: Secondary | ICD-10-CM | POA: Diagnosis not present

## 2015-08-02 DIAGNOSIS — M5441 Lumbago with sciatica, right side: Secondary | ICD-10-CM | POA: Diagnosis not present

## 2015-08-02 DIAGNOSIS — M546 Pain in thoracic spine: Secondary | ICD-10-CM | POA: Diagnosis not present

## 2015-08-02 DIAGNOSIS — M62830 Muscle spasm of back: Secondary | ICD-10-CM | POA: Diagnosis not present

## 2015-08-02 DIAGNOSIS — M5136 Other intervertebral disc degeneration, lumbar region: Secondary | ICD-10-CM | POA: Diagnosis not present

## 2015-08-06 DIAGNOSIS — M546 Pain in thoracic spine: Secondary | ICD-10-CM | POA: Diagnosis not present

## 2015-08-06 DIAGNOSIS — M5137 Other intervertebral disc degeneration, lumbosacral region: Secondary | ICD-10-CM | POA: Diagnosis not present

## 2015-08-06 DIAGNOSIS — M5136 Other intervertebral disc degeneration, lumbar region: Secondary | ICD-10-CM | POA: Diagnosis not present

## 2015-08-06 DIAGNOSIS — M5441 Lumbago with sciatica, right side: Secondary | ICD-10-CM | POA: Diagnosis not present

## 2015-08-06 DIAGNOSIS — M62830 Muscle spasm of back: Secondary | ICD-10-CM | POA: Diagnosis not present

## 2015-08-08 DIAGNOSIS — M5137 Other intervertebral disc degeneration, lumbosacral region: Secondary | ICD-10-CM | POA: Diagnosis not present

## 2015-08-08 DIAGNOSIS — M5441 Lumbago with sciatica, right side: Secondary | ICD-10-CM | POA: Diagnosis not present

## 2015-08-08 DIAGNOSIS — M5136 Other intervertebral disc degeneration, lumbar region: Secondary | ICD-10-CM | POA: Diagnosis not present

## 2015-08-08 DIAGNOSIS — M546 Pain in thoracic spine: Secondary | ICD-10-CM | POA: Diagnosis not present

## 2015-08-08 DIAGNOSIS — M62830 Muscle spasm of back: Secondary | ICD-10-CM | POA: Diagnosis not present

## 2015-08-09 DIAGNOSIS — M5136 Other intervertebral disc degeneration, lumbar region: Secondary | ICD-10-CM | POA: Diagnosis not present

## 2015-08-09 DIAGNOSIS — M5137 Other intervertebral disc degeneration, lumbosacral region: Secondary | ICD-10-CM | POA: Diagnosis not present

## 2015-08-09 DIAGNOSIS — M5441 Lumbago with sciatica, right side: Secondary | ICD-10-CM | POA: Diagnosis not present

## 2015-08-09 DIAGNOSIS — M546 Pain in thoracic spine: Secondary | ICD-10-CM | POA: Diagnosis not present

## 2015-08-09 DIAGNOSIS — M62830 Muscle spasm of back: Secondary | ICD-10-CM | POA: Diagnosis not present

## 2015-08-10 ENCOUNTER — Other Ambulatory Visit: Payer: Self-pay | Admitting: Thoracic Surgery (Cardiothoracic Vascular Surgery)

## 2015-08-10 DIAGNOSIS — R918 Other nonspecific abnormal finding of lung field: Secondary | ICD-10-CM

## 2015-08-10 DIAGNOSIS — A310 Pulmonary mycobacterial infection: Secondary | ICD-10-CM | POA: Diagnosis not present

## 2015-08-15 DIAGNOSIS — M5441 Lumbago with sciatica, right side: Secondary | ICD-10-CM | POA: Diagnosis not present

## 2015-08-15 DIAGNOSIS — M5136 Other intervertebral disc degeneration, lumbar region: Secondary | ICD-10-CM | POA: Diagnosis not present

## 2015-08-15 DIAGNOSIS — M5137 Other intervertebral disc degeneration, lumbosacral region: Secondary | ICD-10-CM | POA: Diagnosis not present

## 2015-08-15 DIAGNOSIS — M546 Pain in thoracic spine: Secondary | ICD-10-CM | POA: Diagnosis not present

## 2015-08-15 DIAGNOSIS — M62830 Muscle spasm of back: Secondary | ICD-10-CM | POA: Diagnosis not present

## 2015-08-17 DIAGNOSIS — Z79899 Other long term (current) drug therapy: Secondary | ICD-10-CM | POA: Diagnosis not present

## 2015-08-17 DIAGNOSIS — Z23 Encounter for immunization: Secondary | ICD-10-CM | POA: Diagnosis not present

## 2015-08-17 DIAGNOSIS — R110 Nausea: Secondary | ICD-10-CM | POA: Diagnosis not present

## 2015-08-17 DIAGNOSIS — F1021 Alcohol dependence, in remission: Secondary | ICD-10-CM | POA: Diagnosis not present

## 2015-08-17 DIAGNOSIS — I10 Essential (primary) hypertension: Secondary | ICD-10-CM | POA: Diagnosis not present

## 2015-08-17 DIAGNOSIS — J432 Centrilobular emphysema: Secondary | ICD-10-CM | POA: Diagnosis not present

## 2015-08-17 DIAGNOSIS — D649 Anemia, unspecified: Secondary | ICD-10-CM | POA: Diagnosis not present

## 2015-08-17 DIAGNOSIS — A310 Pulmonary mycobacterial infection: Secondary | ICD-10-CM | POA: Diagnosis not present

## 2015-08-17 DIAGNOSIS — K210 Gastro-esophageal reflux disease with esophagitis: Secondary | ICD-10-CM | POA: Diagnosis not present

## 2015-08-21 DIAGNOSIS — Z1389 Encounter for screening for other disorder: Secondary | ICD-10-CM | POA: Diagnosis not present

## 2015-08-21 DIAGNOSIS — D649 Anemia, unspecified: Secondary | ICD-10-CM | POA: Diagnosis not present

## 2015-08-21 DIAGNOSIS — Z6822 Body mass index (BMI) 22.0-22.9, adult: Secondary | ICD-10-CM | POA: Diagnosis not present

## 2015-08-21 DIAGNOSIS — A310 Pulmonary mycobacterial infection: Secondary | ICD-10-CM | POA: Diagnosis not present

## 2015-08-28 ENCOUNTER — Ambulatory Visit: Payer: Medicare PPO | Admitting: Thoracic Surgery (Cardiothoracic Vascular Surgery)

## 2015-08-28 ENCOUNTER — Other Ambulatory Visit: Payer: Self-pay

## 2015-08-31 ENCOUNTER — Telehealth: Payer: Self-pay | Admitting: *Deleted

## 2015-09-04 DIAGNOSIS — D649 Anemia, unspecified: Secondary | ICD-10-CM | POA: Diagnosis not present

## 2015-09-04 DIAGNOSIS — E538 Deficiency of other specified B group vitamins: Secondary | ICD-10-CM | POA: Diagnosis not present

## 2015-09-04 DIAGNOSIS — A310 Pulmonary mycobacterial infection: Secondary | ICD-10-CM | POA: Diagnosis not present

## 2015-09-11 ENCOUNTER — Inpatient Hospital Stay: Admission: RE | Admit: 2015-09-11 | Payer: Self-pay | Source: Ambulatory Visit

## 2015-09-11 ENCOUNTER — Ambulatory Visit: Payer: Medicare PPO | Admitting: Thoracic Surgery (Cardiothoracic Vascular Surgery)

## 2015-09-13 DIAGNOSIS — Z1212 Encounter for screening for malignant neoplasm of rectum: Secondary | ICD-10-CM | POA: Diagnosis not present

## 2015-10-04 DIAGNOSIS — J432 Centrilobular emphysema: Secondary | ICD-10-CM | POA: Diagnosis not present

## 2015-10-04 DIAGNOSIS — K210 Gastro-esophageal reflux disease with esophagitis: Secondary | ICD-10-CM | POA: Diagnosis not present

## 2015-10-04 DIAGNOSIS — I10 Essential (primary) hypertension: Secondary | ICD-10-CM | POA: Diagnosis not present

## 2015-10-04 DIAGNOSIS — A310 Pulmonary mycobacterial infection: Secondary | ICD-10-CM | POA: Diagnosis not present

## 2015-10-04 DIAGNOSIS — D649 Anemia, unspecified: Secondary | ICD-10-CM | POA: Diagnosis not present

## 2015-10-04 DIAGNOSIS — E538 Deficiency of other specified B group vitamins: Secondary | ICD-10-CM | POA: Diagnosis not present

## 2015-10-04 DIAGNOSIS — Z79899 Other long term (current) drug therapy: Secondary | ICD-10-CM | POA: Diagnosis not present

## 2015-11-06 DIAGNOSIS — J301 Allergic rhinitis due to pollen: Secondary | ICD-10-CM | POA: Diagnosis not present

## 2015-11-06 DIAGNOSIS — A310 Pulmonary mycobacterial infection: Secondary | ICD-10-CM | POA: Diagnosis not present

## 2015-11-06 DIAGNOSIS — R739 Hyperglycemia, unspecified: Secondary | ICD-10-CM | POA: Diagnosis not present

## 2015-11-06 DIAGNOSIS — K210 Gastro-esophageal reflux disease with esophagitis: Secondary | ICD-10-CM | POA: Diagnosis not present

## 2015-11-06 DIAGNOSIS — R110 Nausea: Secondary | ICD-10-CM | POA: Diagnosis not present

## 2015-11-06 DIAGNOSIS — I10 Essential (primary) hypertension: Secondary | ICD-10-CM | POA: Diagnosis not present

## 2015-11-06 DIAGNOSIS — D649 Anemia, unspecified: Secondary | ICD-10-CM | POA: Diagnosis not present

## 2015-11-06 DIAGNOSIS — E538 Deficiency of other specified B group vitamins: Secondary | ICD-10-CM | POA: Diagnosis not present

## 2015-11-06 DIAGNOSIS — J432 Centrilobular emphysema: Secondary | ICD-10-CM | POA: Diagnosis not present

## 2015-11-06 DIAGNOSIS — Z6822 Body mass index (BMI) 22.0-22.9, adult: Secondary | ICD-10-CM | POA: Diagnosis not present

## 2015-11-27 DIAGNOSIS — M438X4 Other specified deforming dorsopathies, thoracic region: Secondary | ICD-10-CM | POA: Diagnosis not present

## 2015-11-27 DIAGNOSIS — A310 Pulmonary mycobacterial infection: Secondary | ICD-10-CM | POA: Diagnosis not present

## 2015-11-27 DIAGNOSIS — M546 Pain in thoracic spine: Secondary | ICD-10-CM | POA: Diagnosis not present

## 2015-11-27 DIAGNOSIS — Z6823 Body mass index (BMI) 23.0-23.9, adult: Secondary | ICD-10-CM | POA: Diagnosis not present

## 2015-11-27 DIAGNOSIS — S32020A Wedge compression fracture of second lumbar vertebra, initial encounter for closed fracture: Secondary | ICD-10-CM | POA: Diagnosis not present

## 2015-11-27 DIAGNOSIS — J432 Centrilobular emphysema: Secondary | ICD-10-CM | POA: Diagnosis not present

## 2015-11-27 DIAGNOSIS — M549 Dorsalgia, unspecified: Secondary | ICD-10-CM | POA: Diagnosis not present

## 2015-11-29 DIAGNOSIS — M8088XG Other osteoporosis with current pathological fracture, vertebra(e), subsequent encounter for fracture with delayed healing: Secondary | ICD-10-CM | POA: Diagnosis not present

## 2015-11-29 DIAGNOSIS — M8589 Other specified disorders of bone density and structure, multiple sites: Secondary | ICD-10-CM | POA: Diagnosis not present

## 2015-11-29 DIAGNOSIS — Z6823 Body mass index (BMI) 23.0-23.9, adult: Secondary | ICD-10-CM | POA: Diagnosis not present

## 2015-11-29 DIAGNOSIS — M818 Other osteoporosis without current pathological fracture: Secondary | ICD-10-CM | POA: Diagnosis not present

## 2015-12-07 DIAGNOSIS — M8589 Other specified disorders of bone density and structure, multiple sites: Secondary | ICD-10-CM | POA: Diagnosis not present

## 2015-12-07 DIAGNOSIS — M810 Age-related osteoporosis without current pathological fracture: Secondary | ICD-10-CM | POA: Diagnosis not present

## 2015-12-07 DIAGNOSIS — M8088XG Other osteoporosis with current pathological fracture, vertebra(e), subsequent encounter for fracture with delayed healing: Secondary | ICD-10-CM | POA: Diagnosis not present

## 2015-12-10 DIAGNOSIS — S32020A Wedge compression fracture of second lumbar vertebra, initial encounter for closed fracture: Secondary | ICD-10-CM | POA: Diagnosis not present

## 2015-12-10 DIAGNOSIS — A310 Pulmonary mycobacterial infection: Secondary | ICD-10-CM | POA: Diagnosis not present

## 2015-12-10 DIAGNOSIS — I10 Essential (primary) hypertension: Secondary | ICD-10-CM | POA: Diagnosis not present

## 2015-12-10 DIAGNOSIS — D649 Anemia, unspecified: Secondary | ICD-10-CM | POA: Diagnosis not present

## 2015-12-10 DIAGNOSIS — M818 Other osteoporosis without current pathological fracture: Secondary | ICD-10-CM | POA: Diagnosis not present

## 2015-12-10 DIAGNOSIS — E538 Deficiency of other specified B group vitamins: Secondary | ICD-10-CM | POA: Diagnosis not present

## 2015-12-10 DIAGNOSIS — J432 Centrilobular emphysema: Secondary | ICD-10-CM | POA: Diagnosis not present

## 2015-12-10 DIAGNOSIS — K210 Gastro-esophageal reflux disease with esophagitis: Secondary | ICD-10-CM | POA: Diagnosis not present

## 2015-12-27 IMAGING — CT CT CHEST W/O CM
3 of 4 series · 17 of 30 positions shown, 19 images · non-contrast
Comparison: CT chest dated 07/25/2014 and 05/16/2014. PET-CT dated
04/18/2014.

CLINICAL DATA: Cough, shortness of breath, follow-up pleural
thickening

EXAM:
CT CHEST WITHOUT CONTRAST
TECHNIQUE: Multidetector CT imaging of the chest was performed following the
standard protocol without IV contrast..

[Series 3: chest w/o · axial · non-contrast · 0.78mm/px · z∈[-236,-16]mm · 5 of 66 slices shown, 7 images]
[im 11/66  mediastinal]
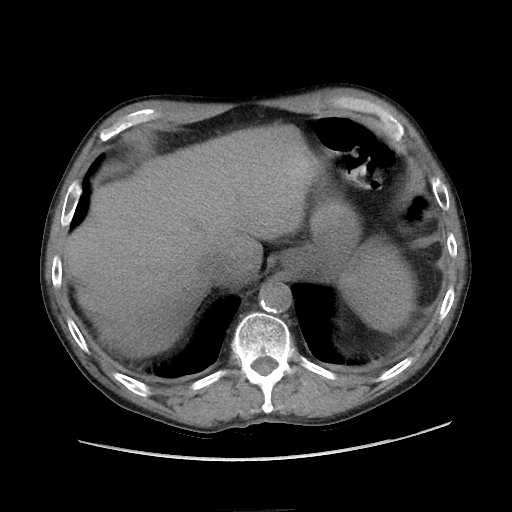
[im 11/66  lung]
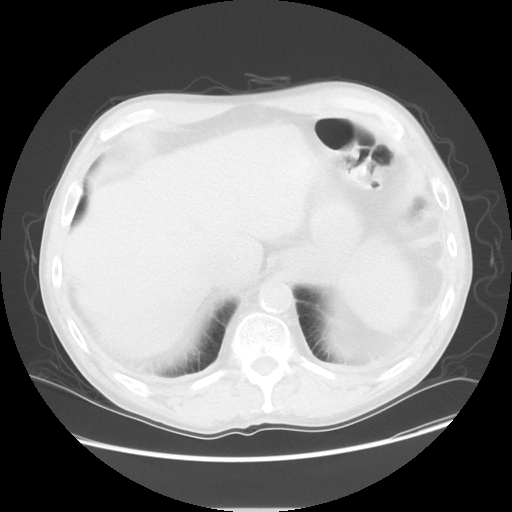
[im 22/66  lung]
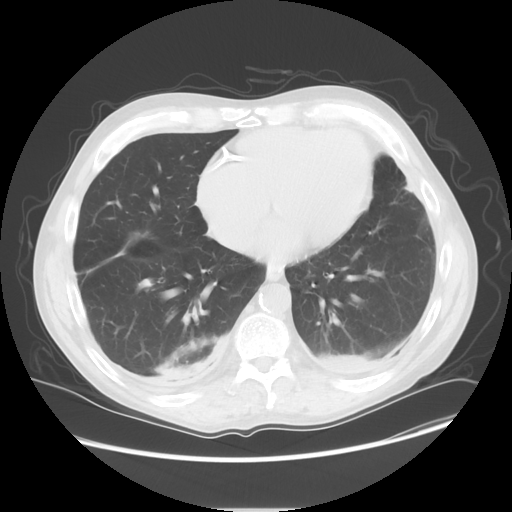
[im 33/66  lung]
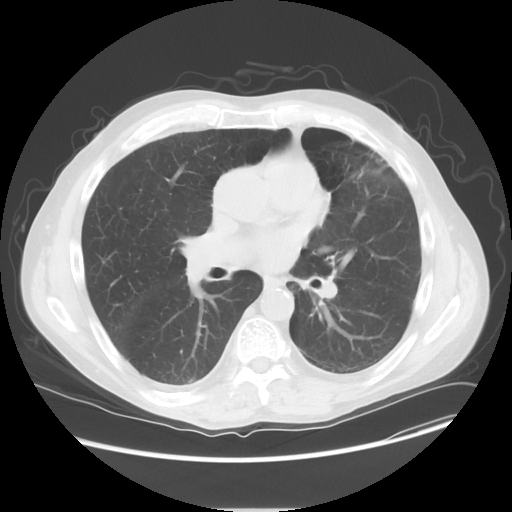
[im 44/66  lung]
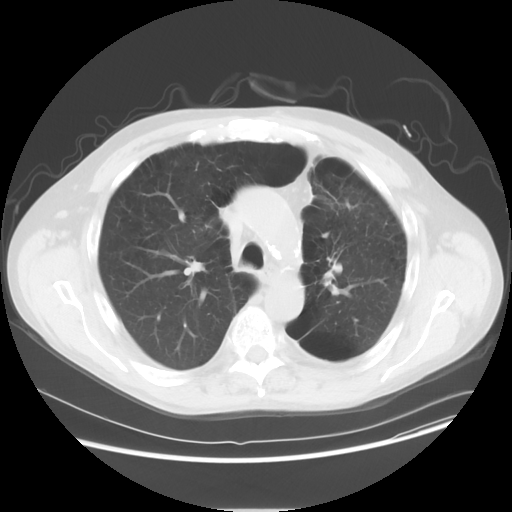
[im 55/66  mediastinal]
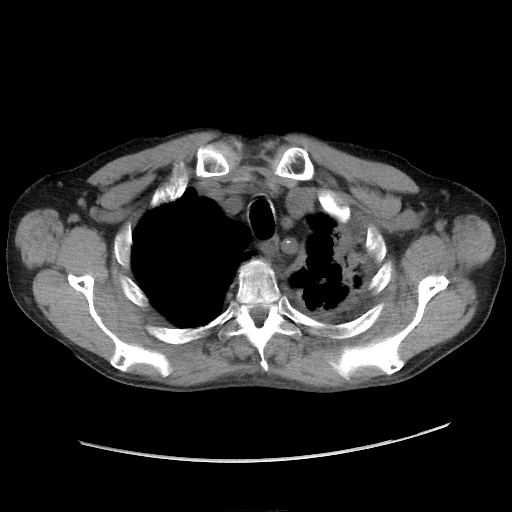
[im 55/66  lung]
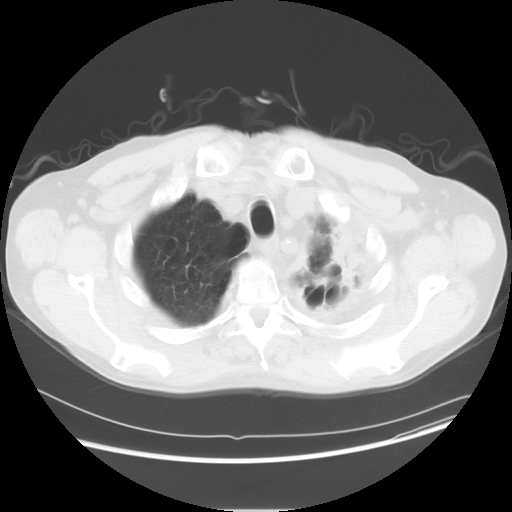

[Series 4: lung windows · axial · 0.78mm/px · z∈[-220,-26]mm · 4 of 66 slices shown]
[im 14/66  lung]
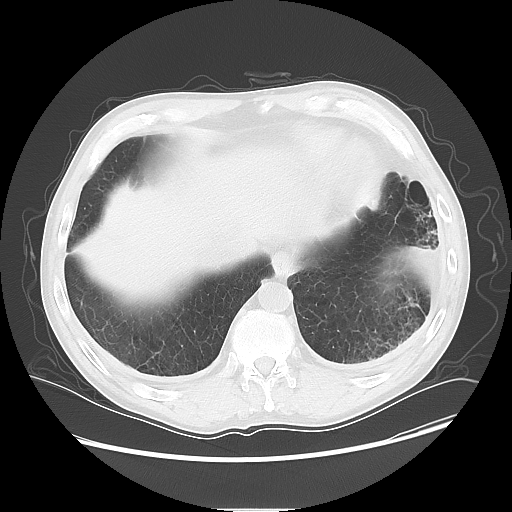
[im 27/66  lung]
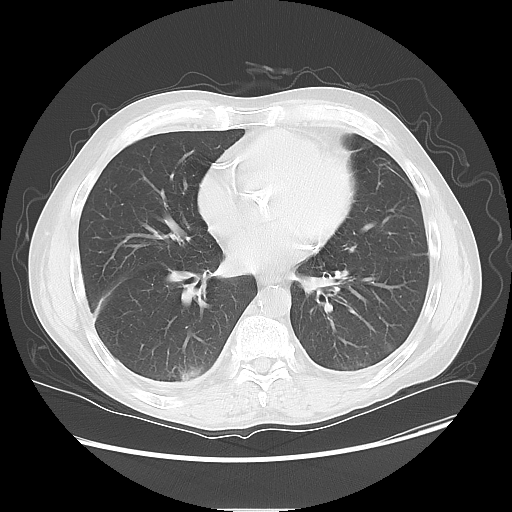
[im 40/66  lung]
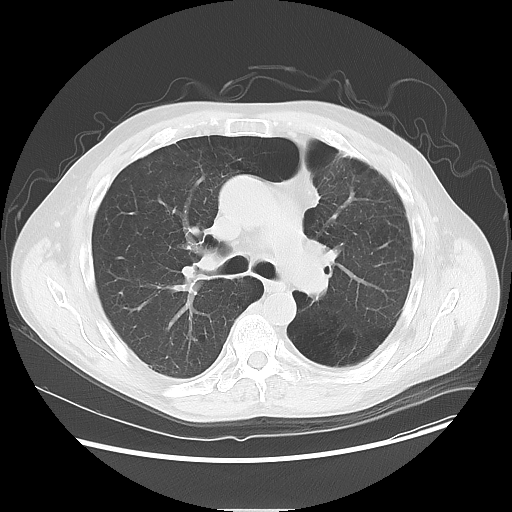
[im 53/66  lung]
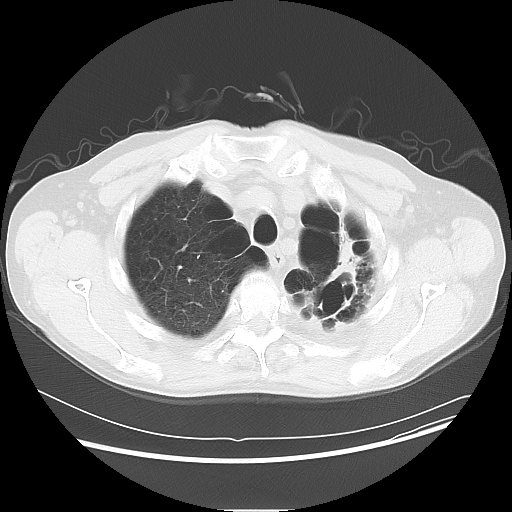

[Series 602: sagittal body · sagittal · 0.78mm/px · 8 of 161 slices shown]
[im 12/161  mediastinal]
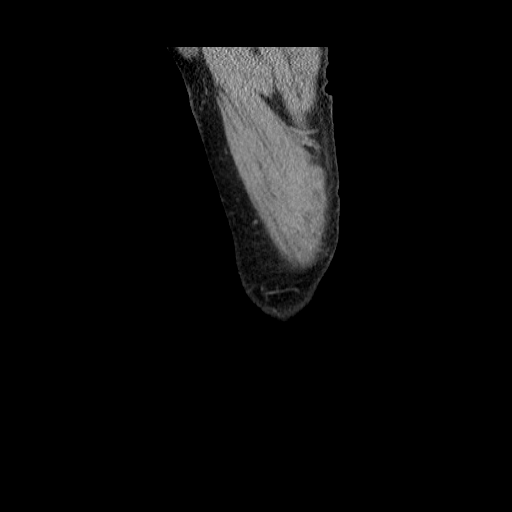
[im 35/161  mediastinal]
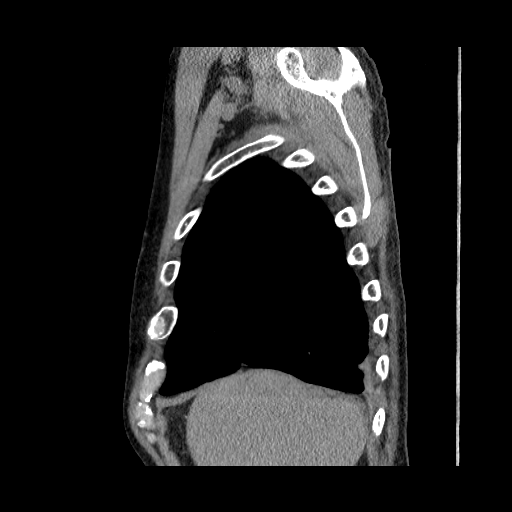
[im 58/161  mediastinal]
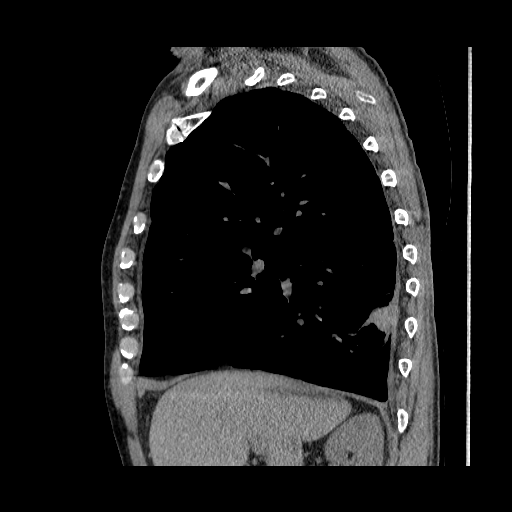
[im 69/161  mediastinal]
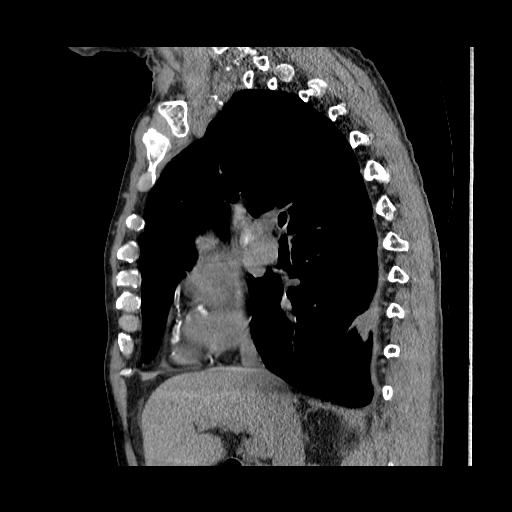
[im 92/161  mediastinal]
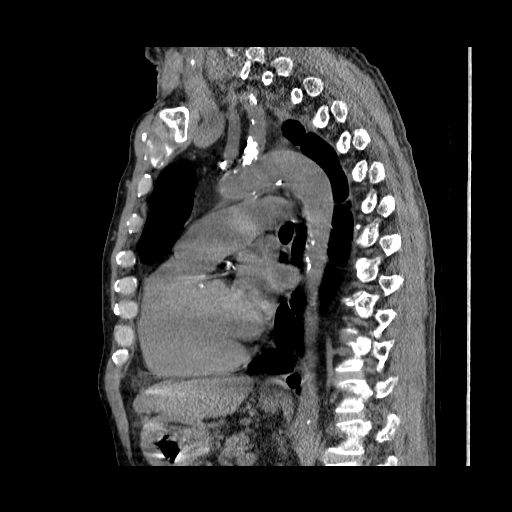
[im 103/161  mediastinal]
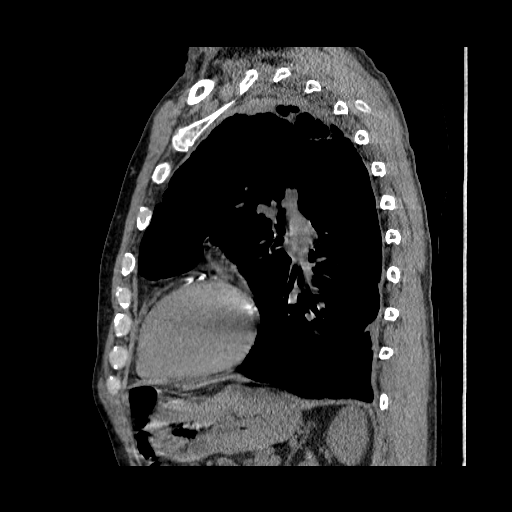
[im 126/161  mediastinal]
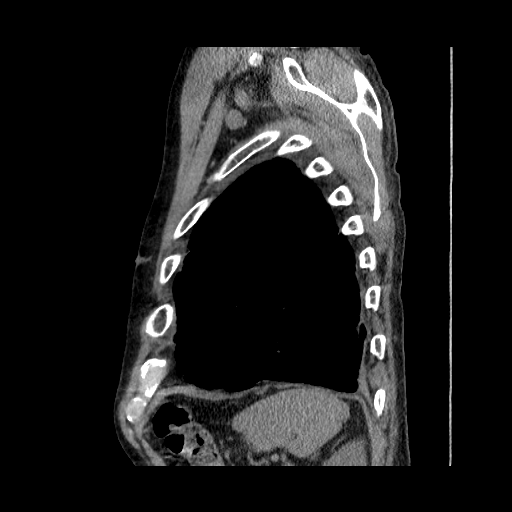
[im 149/161  mediastinal]
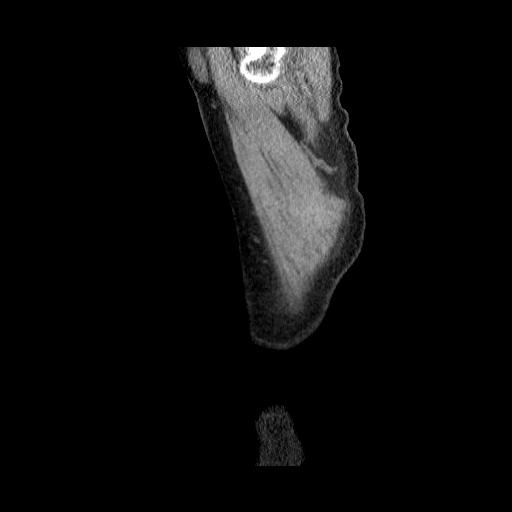

[17 of 30 positions shown; findings below may reference images not displayed]

FINDINGS: Moderate centrilobular and paraseptal emphysematous changes, most
severe at the bilateral lung apices.

Stable left apical mass-like opacity (series 4/ image 13), unchanged
on recent prior studies (since at least 04/10/2014) although
new/progressed from 6985, with mild/low grade hypermetabolism on
PET. This appearance remains most compatible with left apical
pleural parenchymal scarring, likely post infectious/inflammatory.

No new/suspicious pulmonary nodules. Mild scarring/ atelectasis at
the lung bases. Trace bilateral pleural effusions. No pneumothorax.

Visualized thyroid is unremarkable.

The heart is normal in size. No pericardial effusion. Left main and
three vessel coronary atherosclerosis. Atherosclerotic
calcifications of the aortic arch. Ectasia of the ascending thoracic
aorta, measuring 3.9 cm.

Small mediastinal lymph nodes which do not meet pathologic CT size
criteria. No suspicious axillary lymphadenopathy.

Visualized upper abdomen is notable for vascular calcifications.

Mild degenerative changes of the visualized thoracolumbar spine.
IMPRESSION: Stable left apical mass-like opacity, unchanged since April 2014,
likely reflecting post infectious/inflammatory pleural parenchymal
scarring.

Underlying moderate emphysematous changes.

## 2016-01-07 DIAGNOSIS — S32020A Wedge compression fracture of second lumbar vertebra, initial encounter for closed fracture: Secondary | ICD-10-CM | POA: Diagnosis not present

## 2016-01-07 DIAGNOSIS — R0602 Shortness of breath: Secondary | ICD-10-CM | POA: Diagnosis not present

## 2016-01-07 DIAGNOSIS — I10 Essential (primary) hypertension: Secondary | ICD-10-CM | POA: Diagnosis not present

## 2016-01-07 DIAGNOSIS — J432 Centrilobular emphysema: Secondary | ICD-10-CM | POA: Diagnosis not present

## 2016-01-07 DIAGNOSIS — K210 Gastro-esophageal reflux disease with esophagitis: Secondary | ICD-10-CM | POA: Diagnosis not present

## 2016-01-07 DIAGNOSIS — M818 Other osteoporosis without current pathological fracture: Secondary | ICD-10-CM | POA: Diagnosis not present

## 2016-01-07 DIAGNOSIS — A310 Pulmonary mycobacterial infection: Secondary | ICD-10-CM | POA: Diagnosis not present

## 2016-01-07 DIAGNOSIS — D649 Anemia, unspecified: Secondary | ICD-10-CM | POA: Diagnosis not present

## 2016-01-14 DIAGNOSIS — R0602 Shortness of breath: Secondary | ICD-10-CM | POA: Diagnosis not present

## 2016-01-17 DIAGNOSIS — J449 Chronic obstructive pulmonary disease, unspecified: Secondary | ICD-10-CM | POA: Diagnosis not present

## 2016-01-31 DIAGNOSIS — A310 Pulmonary mycobacterial infection: Secondary | ICD-10-CM | POA: Diagnosis not present

## 2016-02-08 DIAGNOSIS — J449 Chronic obstructive pulmonary disease, unspecified: Secondary | ICD-10-CM | POA: Diagnosis not present

## 2016-02-08 DIAGNOSIS — A310 Pulmonary mycobacterial infection: Secondary | ICD-10-CM | POA: Diagnosis not present

## 2016-02-08 DIAGNOSIS — R634 Abnormal weight loss: Secondary | ICD-10-CM | POA: Diagnosis not present

## 2016-02-08 DIAGNOSIS — R918 Other nonspecific abnormal finding of lung field: Secondary | ICD-10-CM | POA: Diagnosis not present

## 2016-02-08 DIAGNOSIS — R05 Cough: Secondary | ICD-10-CM | POA: Diagnosis not present

## 2016-02-08 DIAGNOSIS — J439 Emphysema, unspecified: Secondary | ICD-10-CM | POA: Diagnosis not present

## 2016-02-17 DIAGNOSIS — J449 Chronic obstructive pulmonary disease, unspecified: Secondary | ICD-10-CM | POA: Diagnosis not present

## 2016-03-13 DIAGNOSIS — A310 Pulmonary mycobacterial infection: Secondary | ICD-10-CM | POA: Diagnosis not present

## 2016-03-18 DIAGNOSIS — J432 Centrilobular emphysema: Secondary | ICD-10-CM | POA: Diagnosis not present

## 2016-03-18 DIAGNOSIS — R110 Nausea: Secondary | ICD-10-CM | POA: Diagnosis not present

## 2016-03-18 DIAGNOSIS — Z1389 Encounter for screening for other disorder: Secondary | ICD-10-CM | POA: Diagnosis not present

## 2016-03-18 DIAGNOSIS — R5383 Other fatigue: Secondary | ICD-10-CM | POA: Diagnosis not present

## 2016-04-18 DIAGNOSIS — J449 Chronic obstructive pulmonary disease, unspecified: Secondary | ICD-10-CM | POA: Diagnosis not present

## 2016-04-24 DIAGNOSIS — A310 Pulmonary mycobacterial infection: Secondary | ICD-10-CM | POA: Diagnosis not present

## 2016-04-24 DIAGNOSIS — G4700 Insomnia, unspecified: Secondary | ICD-10-CM | POA: Diagnosis not present

## 2016-05-18 DIAGNOSIS — J432 Centrilobular emphysema: Secondary | ICD-10-CM | POA: Diagnosis not present

## 2016-05-18 DIAGNOSIS — R110 Nausea: Secondary | ICD-10-CM | POA: Diagnosis not present

## 2016-05-18 DIAGNOSIS — J449 Chronic obstructive pulmonary disease, unspecified: Secondary | ICD-10-CM | POA: Diagnosis not present

## 2016-05-18 DIAGNOSIS — R5383 Other fatigue: Secondary | ICD-10-CM | POA: Diagnosis not present

## 2016-06-06 DIAGNOSIS — K210 Gastro-esophageal reflux disease with esophagitis: Secondary | ICD-10-CM | POA: Diagnosis not present

## 2016-06-06 DIAGNOSIS — Z79899 Other long term (current) drug therapy: Secondary | ICD-10-CM | POA: Diagnosis not present

## 2016-06-06 DIAGNOSIS — S32020A Wedge compression fracture of second lumbar vertebra, initial encounter for closed fracture: Secondary | ICD-10-CM | POA: Diagnosis not present

## 2016-06-06 DIAGNOSIS — J432 Centrilobular emphysema: Secondary | ICD-10-CM | POA: Diagnosis not present

## 2016-06-06 DIAGNOSIS — D649 Anemia, unspecified: Secondary | ICD-10-CM | POA: Diagnosis not present

## 2016-06-06 DIAGNOSIS — Z6823 Body mass index (BMI) 23.0-23.9, adult: Secondary | ICD-10-CM | POA: Diagnosis not present

## 2016-06-06 DIAGNOSIS — M818 Other osteoporosis without current pathological fracture: Secondary | ICD-10-CM | POA: Diagnosis not present

## 2016-06-06 DIAGNOSIS — I10 Essential (primary) hypertension: Secondary | ICD-10-CM | POA: Diagnosis not present

## 2016-06-06 DIAGNOSIS — A310 Pulmonary mycobacterial infection: Secondary | ICD-10-CM | POA: Diagnosis not present

## 2016-06-18 DIAGNOSIS — R110 Nausea: Secondary | ICD-10-CM | POA: Diagnosis not present

## 2016-06-18 DIAGNOSIS — J432 Centrilobular emphysema: Secondary | ICD-10-CM | POA: Diagnosis not present

## 2016-06-18 DIAGNOSIS — R5383 Other fatigue: Secondary | ICD-10-CM | POA: Diagnosis not present

## 2016-06-18 DIAGNOSIS — J449 Chronic obstructive pulmonary disease, unspecified: Secondary | ICD-10-CM | POA: Diagnosis not present

## 2016-06-26 DIAGNOSIS — Z720 Tobacco use: Secondary | ICD-10-CM | POA: Diagnosis not present

## 2016-06-26 DIAGNOSIS — A310 Pulmonary mycobacterial infection: Secondary | ICD-10-CM | POA: Diagnosis not present

## 2016-07-10 DIAGNOSIS — A319 Mycobacterial infection, unspecified: Secondary | ICD-10-CM | POA: Diagnosis not present

## 2016-07-10 DIAGNOSIS — I779 Disorder of arteries and arterioles, unspecified: Secondary | ICD-10-CM | POA: Diagnosis not present

## 2016-07-10 DIAGNOSIS — A310 Pulmonary mycobacterial infection: Secondary | ICD-10-CM | POA: Diagnosis not present

## 2016-07-10 DIAGNOSIS — R51 Headache: Secondary | ICD-10-CM | POA: Diagnosis not present

## 2016-07-10 DIAGNOSIS — Z720 Tobacco use: Secondary | ICD-10-CM | POA: Diagnosis not present

## 2016-07-10 DIAGNOSIS — I6523 Occlusion and stenosis of bilateral carotid arteries: Secondary | ICD-10-CM | POA: Diagnosis not present

## 2016-07-10 DIAGNOSIS — J189 Pneumonia, unspecified organism: Secondary | ICD-10-CM | POA: Diagnosis not present

## 2016-07-10 DIAGNOSIS — I6522 Occlusion and stenosis of left carotid artery: Secondary | ICD-10-CM | POA: Diagnosis not present

## 2016-07-10 DIAGNOSIS — R42 Dizziness and giddiness: Secondary | ICD-10-CM | POA: Diagnosis not present

## 2016-07-17 DIAGNOSIS — I6523 Occlusion and stenosis of bilateral carotid arteries: Secondary | ICD-10-CM | POA: Diagnosis not present

## 2016-07-17 DIAGNOSIS — I779 Disorder of arteries and arterioles, unspecified: Secondary | ICD-10-CM | POA: Diagnosis not present

## 2016-07-17 DIAGNOSIS — R42 Dizziness and giddiness: Secondary | ICD-10-CM | POA: Diagnosis not present

## 2016-07-17 DIAGNOSIS — I6522 Occlusion and stenosis of left carotid artery: Secondary | ICD-10-CM | POA: Diagnosis not present

## 2016-07-17 DIAGNOSIS — A310 Pulmonary mycobacterial infection: Secondary | ICD-10-CM | POA: Diagnosis not present

## 2016-07-17 DIAGNOSIS — R51 Headache: Secondary | ICD-10-CM | POA: Diagnosis not present

## 2016-07-19 DIAGNOSIS — J432 Centrilobular emphysema: Secondary | ICD-10-CM | POA: Diagnosis not present

## 2016-07-19 DIAGNOSIS — R5383 Other fatigue: Secondary | ICD-10-CM | POA: Diagnosis not present

## 2016-07-19 DIAGNOSIS — Z1389 Encounter for screening for other disorder: Secondary | ICD-10-CM | POA: Diagnosis not present

## 2016-07-19 DIAGNOSIS — R110 Nausea: Secondary | ICD-10-CM | POA: Diagnosis not present

## 2016-08-01 DIAGNOSIS — I6522 Occlusion and stenosis of left carotid artery: Secondary | ICD-10-CM | POA: Diagnosis not present

## 2016-08-01 DIAGNOSIS — I6521 Occlusion and stenosis of right carotid artery: Secondary | ICD-10-CM | POA: Diagnosis not present

## 2016-08-01 DIAGNOSIS — Z01818 Encounter for other preprocedural examination: Secondary | ICD-10-CM | POA: Diagnosis not present

## 2016-08-18 DIAGNOSIS — Z1389 Encounter for screening for other disorder: Secondary | ICD-10-CM | POA: Diagnosis not present

## 2016-08-18 DIAGNOSIS — R5383 Other fatigue: Secondary | ICD-10-CM | POA: Diagnosis not present

## 2016-08-18 DIAGNOSIS — R110 Nausea: Secondary | ICD-10-CM | POA: Diagnosis not present

## 2016-08-18 DIAGNOSIS — J432 Centrilobular emphysema: Secondary | ICD-10-CM | POA: Diagnosis not present

## 2016-08-21 DIAGNOSIS — K210 Gastro-esophageal reflux disease with esophagitis: Secondary | ICD-10-CM | POA: Diagnosis not present

## 2016-08-21 DIAGNOSIS — J432 Centrilobular emphysema: Secondary | ICD-10-CM | POA: Diagnosis not present

## 2016-08-21 DIAGNOSIS — R229 Localized swelling, mass and lump, unspecified: Secondary | ICD-10-CM | POA: Diagnosis not present

## 2016-08-21 DIAGNOSIS — A310 Pulmonary mycobacterial infection: Secondary | ICD-10-CM | POA: Diagnosis not present

## 2016-08-21 DIAGNOSIS — Z1389 Encounter for screening for other disorder: Secondary | ICD-10-CM | POA: Diagnosis not present

## 2016-08-21 DIAGNOSIS — D649 Anemia, unspecified: Secondary | ICD-10-CM | POA: Diagnosis not present

## 2016-08-21 DIAGNOSIS — S32020A Wedge compression fracture of second lumbar vertebra, initial encounter for closed fracture: Secondary | ICD-10-CM | POA: Diagnosis not present

## 2016-08-21 DIAGNOSIS — I10 Essential (primary) hypertension: Secondary | ICD-10-CM | POA: Diagnosis not present

## 2016-08-21 DIAGNOSIS — Z9889 Other specified postprocedural states: Secondary | ICD-10-CM | POA: Diagnosis not present

## 2016-09-04 DIAGNOSIS — M4644 Discitis, unspecified, thoracic region: Secondary | ICD-10-CM | POA: Diagnosis not present

## 2016-09-04 DIAGNOSIS — A310 Pulmonary mycobacterial infection: Secondary | ICD-10-CM | POA: Diagnosis not present

## 2016-09-10 DIAGNOSIS — F17210 Nicotine dependence, cigarettes, uncomplicated: Secondary | ICD-10-CM | POA: Diagnosis not present

## 2016-09-10 DIAGNOSIS — A310 Pulmonary mycobacterial infection: Secondary | ICD-10-CM | POA: Diagnosis not present

## 2016-09-10 DIAGNOSIS — R05 Cough: Secondary | ICD-10-CM | POA: Diagnosis not present

## 2016-09-10 DIAGNOSIS — A319 Mycobacterial infection, unspecified: Secondary | ICD-10-CM | POA: Diagnosis not present

## 2016-09-10 DIAGNOSIS — J449 Chronic obstructive pulmonary disease, unspecified: Secondary | ICD-10-CM | POA: Diagnosis not present

## 2016-09-15 DIAGNOSIS — M4854XA Collapsed vertebra, not elsewhere classified, thoracic region, initial encounter for fracture: Secondary | ICD-10-CM | POA: Diagnosis not present

## 2016-09-15 DIAGNOSIS — M4644 Discitis, unspecified, thoracic region: Secondary | ICD-10-CM | POA: Diagnosis not present

## 2016-09-15 DIAGNOSIS — R29890 Loss of height: Secondary | ICD-10-CM | POA: Diagnosis not present

## 2016-09-15 DIAGNOSIS — M40204 Unspecified kyphosis, thoracic region: Secondary | ICD-10-CM | POA: Diagnosis not present

## 2016-09-18 DIAGNOSIS — R110 Nausea: Secondary | ICD-10-CM | POA: Diagnosis not present

## 2016-09-18 DIAGNOSIS — Z1389 Encounter for screening for other disorder: Secondary | ICD-10-CM | POA: Diagnosis not present

## 2016-09-18 DIAGNOSIS — J432 Centrilobular emphysema: Secondary | ICD-10-CM | POA: Diagnosis not present

## 2016-09-18 DIAGNOSIS — R5383 Other fatigue: Secondary | ICD-10-CM | POA: Diagnosis not present

## 2016-09-22 DIAGNOSIS — Z79899 Other long term (current) drug therapy: Secondary | ICD-10-CM | POA: Diagnosis not present

## 2016-09-22 DIAGNOSIS — A310 Pulmonary mycobacterial infection: Secondary | ICD-10-CM | POA: Diagnosis not present

## 2016-09-22 DIAGNOSIS — I10 Essential (primary) hypertension: Secondary | ICD-10-CM | POA: Diagnosis not present

## 2016-09-22 DIAGNOSIS — E538 Deficiency of other specified B group vitamins: Secondary | ICD-10-CM | POA: Diagnosis not present

## 2016-09-22 DIAGNOSIS — Z6821 Body mass index (BMI) 21.0-21.9, adult: Secondary | ICD-10-CM | POA: Diagnosis not present

## 2016-09-22 DIAGNOSIS — S32020A Wedge compression fracture of second lumbar vertebra, initial encounter for closed fracture: Secondary | ICD-10-CM | POA: Diagnosis not present

## 2016-09-22 DIAGNOSIS — M818 Other osteoporosis without current pathological fracture: Secondary | ICD-10-CM | POA: Diagnosis not present

## 2016-09-22 DIAGNOSIS — M4624 Osteomyelitis of vertebra, thoracic region: Secondary | ICD-10-CM | POA: Diagnosis not present

## 2016-09-22 DIAGNOSIS — J432 Centrilobular emphysema: Secondary | ICD-10-CM | POA: Diagnosis not present

## 2016-10-02 DIAGNOSIS — M4644 Discitis, unspecified, thoracic region: Secondary | ICD-10-CM | POA: Diagnosis not present

## 2016-10-02 DIAGNOSIS — A310 Pulmonary mycobacterial infection: Secondary | ICD-10-CM | POA: Diagnosis not present

## 2016-10-06 DIAGNOSIS — I6522 Occlusion and stenosis of left carotid artery: Secondary | ICD-10-CM | POA: Diagnosis not present

## 2016-10-06 DIAGNOSIS — G4730 Sleep apnea, unspecified: Secondary | ICD-10-CM | POA: Diagnosis not present

## 2016-10-06 DIAGNOSIS — M5184 Other intervertebral disc disorders, thoracic region: Secondary | ICD-10-CM | POA: Diagnosis not present

## 2016-10-06 DIAGNOSIS — R0602 Shortness of breath: Secondary | ICD-10-CM | POA: Diagnosis not present

## 2016-10-06 DIAGNOSIS — A310 Pulmonary mycobacterial infection: Secondary | ICD-10-CM | POA: Diagnosis not present

## 2016-10-06 DIAGNOSIS — R079 Chest pain, unspecified: Secondary | ICD-10-CM | POA: Diagnosis not present

## 2016-10-06 DIAGNOSIS — I10 Essential (primary) hypertension: Secondary | ICD-10-CM | POA: Diagnosis not present

## 2016-10-06 DIAGNOSIS — Z0389 Encounter for observation for other suspected diseases and conditions ruled out: Secondary | ICD-10-CM | POA: Diagnosis not present

## 2016-10-06 DIAGNOSIS — R042 Hemoptysis: Secondary | ICD-10-CM | POA: Diagnosis not present

## 2016-10-06 DIAGNOSIS — T887XXA Unspecified adverse effect of drug or medicament, initial encounter: Secondary | ICD-10-CM | POA: Diagnosis not present

## 2016-10-06 DIAGNOSIS — J449 Chronic obstructive pulmonary disease, unspecified: Secondary | ICD-10-CM | POA: Diagnosis not present

## 2016-10-06 DIAGNOSIS — M4644 Discitis, unspecified, thoracic region: Secondary | ICD-10-CM | POA: Diagnosis not present

## 2016-10-06 DIAGNOSIS — F329 Major depressive disorder, single episode, unspecified: Secondary | ICD-10-CM | POA: Diagnosis not present

## 2016-10-06 DIAGNOSIS — J156 Pneumonia due to other aerobic Gram-negative bacteria: Secondary | ICD-10-CM | POA: Diagnosis not present

## 2016-10-13 DIAGNOSIS — J479 Bronchiectasis, uncomplicated: Secondary | ICD-10-CM | POA: Diagnosis not present

## 2016-10-18 DIAGNOSIS — J432 Centrilobular emphysema: Secondary | ICD-10-CM | POA: Diagnosis not present

## 2016-10-18 DIAGNOSIS — R110 Nausea: Secondary | ICD-10-CM | POA: Diagnosis not present

## 2016-10-18 DIAGNOSIS — R5383 Other fatigue: Secondary | ICD-10-CM | POA: Diagnosis not present

## 2016-10-18 DIAGNOSIS — J449 Chronic obstructive pulmonary disease, unspecified: Secondary | ICD-10-CM | POA: Diagnosis not present

## 2016-11-13 DIAGNOSIS — A310 Pulmonary mycobacterial infection: Secondary | ICD-10-CM | POA: Diagnosis not present

## 2016-11-13 DIAGNOSIS — J156 Pneumonia due to other aerobic Gram-negative bacteria: Secondary | ICD-10-CM | POA: Diagnosis not present

## 2016-11-13 DIAGNOSIS — A319 Mycobacterial infection, unspecified: Secondary | ICD-10-CM | POA: Diagnosis not present

## 2016-11-13 DIAGNOSIS — Z9889 Other specified postprocedural states: Secondary | ICD-10-CM | POA: Diagnosis not present

## 2016-11-13 DIAGNOSIS — J449 Chronic obstructive pulmonary disease, unspecified: Secondary | ICD-10-CM | POA: Diagnosis not present

## 2016-11-13 DIAGNOSIS — J479 Bronchiectasis, uncomplicated: Secondary | ICD-10-CM | POA: Diagnosis not present

## 2016-11-13 DIAGNOSIS — F17210 Nicotine dependence, cigarettes, uncomplicated: Secondary | ICD-10-CM | POA: Diagnosis not present

## 2016-11-13 DIAGNOSIS — M4644 Discitis, unspecified, thoracic region: Secondary | ICD-10-CM | POA: Diagnosis not present

## 2016-11-18 DIAGNOSIS — R110 Nausea: Secondary | ICD-10-CM | POA: Diagnosis not present

## 2016-11-18 DIAGNOSIS — R5383 Other fatigue: Secondary | ICD-10-CM | POA: Diagnosis not present

## 2016-11-18 DIAGNOSIS — J432 Centrilobular emphysema: Secondary | ICD-10-CM | POA: Diagnosis not present

## 2016-11-18 DIAGNOSIS — J449 Chronic obstructive pulmonary disease, unspecified: Secondary | ICD-10-CM | POA: Diagnosis not present

## 2016-12-04 DIAGNOSIS — A310 Pulmonary mycobacterial infection: Secondary | ICD-10-CM | POA: Diagnosis not present

## 2016-12-04 DIAGNOSIS — J929 Pleural plaque without asbestos: Secondary | ICD-10-CM | POA: Diagnosis not present

## 2016-12-04 DIAGNOSIS — R042 Hemoptysis: Secondary | ICD-10-CM | POA: Diagnosis not present

## 2016-12-04 DIAGNOSIS — M4644 Discitis, unspecified, thoracic region: Secondary | ICD-10-CM | POA: Diagnosis not present

## 2016-12-04 DIAGNOSIS — J156 Pneumonia due to other aerobic Gram-negative bacteria: Secondary | ICD-10-CM | POA: Diagnosis not present

## 2016-12-14 DIAGNOSIS — J479 Bronchiectasis, uncomplicated: Secondary | ICD-10-CM | POA: Diagnosis not present

## 2016-12-19 DIAGNOSIS — R5383 Other fatigue: Secondary | ICD-10-CM | POA: Diagnosis not present

## 2016-12-19 DIAGNOSIS — M546 Pain in thoracic spine: Secondary | ICD-10-CM | POA: Diagnosis not present

## 2016-12-19 DIAGNOSIS — G8929 Other chronic pain: Secondary | ICD-10-CM | POA: Diagnosis not present

## 2016-12-19 DIAGNOSIS — M4644 Discitis, unspecified, thoracic region: Secondary | ICD-10-CM | POA: Diagnosis not present

## 2016-12-19 DIAGNOSIS — J432 Centrilobular emphysema: Secondary | ICD-10-CM | POA: Diagnosis not present

## 2016-12-19 DIAGNOSIS — J449 Chronic obstructive pulmonary disease, unspecified: Secondary | ICD-10-CM | POA: Diagnosis not present

## 2016-12-19 DIAGNOSIS — R110 Nausea: Secondary | ICD-10-CM | POA: Diagnosis not present

## 2016-12-19 DIAGNOSIS — M545 Low back pain: Secondary | ICD-10-CM | POA: Diagnosis not present

## 2017-01-11 DIAGNOSIS — J479 Bronchiectasis, uncomplicated: Secondary | ICD-10-CM | POA: Diagnosis not present

## 2017-01-12 DIAGNOSIS — G8929 Other chronic pain: Secondary | ICD-10-CM | POA: Diagnosis not present

## 2017-01-12 DIAGNOSIS — M4644 Discitis, unspecified, thoracic region: Secondary | ICD-10-CM | POA: Diagnosis not present

## 2017-01-15 DIAGNOSIS — A310 Pulmonary mycobacterial infection: Secondary | ICD-10-CM | POA: Diagnosis not present

## 2017-01-15 DIAGNOSIS — M4644 Discitis, unspecified, thoracic region: Secondary | ICD-10-CM | POA: Diagnosis not present

## 2017-01-15 DIAGNOSIS — M546 Pain in thoracic spine: Secondary | ICD-10-CM | POA: Diagnosis not present

## 2017-01-15 DIAGNOSIS — J449 Chronic obstructive pulmonary disease, unspecified: Secondary | ICD-10-CM | POA: Diagnosis not present

## 2017-01-15 DIAGNOSIS — J439 Emphysema, unspecified: Secondary | ICD-10-CM | POA: Diagnosis not present

## 2017-01-16 DIAGNOSIS — R5383 Other fatigue: Secondary | ICD-10-CM | POA: Diagnosis not present

## 2017-01-16 DIAGNOSIS — Z1389 Encounter for screening for other disorder: Secondary | ICD-10-CM | POA: Diagnosis not present

## 2017-01-16 DIAGNOSIS — R110 Nausea: Secondary | ICD-10-CM | POA: Diagnosis not present

## 2017-01-16 DIAGNOSIS — J432 Centrilobular emphysema: Secondary | ICD-10-CM | POA: Diagnosis not present

## 2017-02-11 DIAGNOSIS — J479 Bronchiectasis, uncomplicated: Secondary | ICD-10-CM | POA: Diagnosis not present

## 2017-02-16 DIAGNOSIS — R110 Nausea: Secondary | ICD-10-CM | POA: Diagnosis not present

## 2017-02-16 DIAGNOSIS — R5383 Other fatigue: Secondary | ICD-10-CM | POA: Diagnosis not present

## 2017-02-16 DIAGNOSIS — J432 Centrilobular emphysema: Secondary | ICD-10-CM | POA: Diagnosis not present

## 2017-02-16 DIAGNOSIS — J449 Chronic obstructive pulmonary disease, unspecified: Secondary | ICD-10-CM | POA: Diagnosis not present

## 2017-03-13 DIAGNOSIS — J479 Bronchiectasis, uncomplicated: Secondary | ICD-10-CM | POA: Diagnosis not present

## 2017-03-13 DIAGNOSIS — A319 Mycobacterial infection, unspecified: Secondary | ICD-10-CM | POA: Diagnosis not present

## 2017-03-13 DIAGNOSIS — F17210 Nicotine dependence, cigarettes, uncomplicated: Secondary | ICD-10-CM | POA: Diagnosis not present

## 2017-03-18 DIAGNOSIS — J449 Chronic obstructive pulmonary disease, unspecified: Secondary | ICD-10-CM | POA: Diagnosis not present

## 2017-04-08 DIAGNOSIS — M545 Low back pain: Secondary | ICD-10-CM | POA: Diagnosis not present

## 2017-04-08 DIAGNOSIS — M4644 Discitis, unspecified, thoracic region: Secondary | ICD-10-CM | POA: Diagnosis not present

## 2017-04-08 DIAGNOSIS — G8929 Other chronic pain: Secondary | ICD-10-CM | POA: Diagnosis not present

## 2017-04-08 DIAGNOSIS — M546 Pain in thoracic spine: Secondary | ICD-10-CM | POA: Diagnosis not present

## 2017-04-13 DIAGNOSIS — J479 Bronchiectasis, uncomplicated: Secondary | ICD-10-CM | POA: Diagnosis not present

## 2017-04-16 DIAGNOSIS — J449 Chronic obstructive pulmonary disease, unspecified: Secondary | ICD-10-CM | POA: Diagnosis not present

## 2017-04-16 DIAGNOSIS — R05 Cough: Secondary | ICD-10-CM | POA: Diagnosis not present

## 2017-04-16 DIAGNOSIS — M4644 Discitis, unspecified, thoracic region: Secondary | ICD-10-CM | POA: Diagnosis not present

## 2017-04-16 DIAGNOSIS — A310 Pulmonary mycobacterial infection: Secondary | ICD-10-CM | POA: Diagnosis not present

## 2017-04-18 DIAGNOSIS — R5383 Other fatigue: Secondary | ICD-10-CM | POA: Diagnosis not present

## 2017-04-18 DIAGNOSIS — Z1389 Encounter for screening for other disorder: Secondary | ICD-10-CM | POA: Diagnosis not present

## 2017-04-18 DIAGNOSIS — R110 Nausea: Secondary | ICD-10-CM | POA: Diagnosis not present

## 2017-04-18 DIAGNOSIS — J432 Centrilobular emphysema: Secondary | ICD-10-CM | POA: Diagnosis not present

## 2017-04-29 DIAGNOSIS — J449 Chronic obstructive pulmonary disease, unspecified: Secondary | ICD-10-CM | POA: Diagnosis not present

## 2017-04-29 DIAGNOSIS — R0602 Shortness of breath: Secondary | ICD-10-CM | POA: Diagnosis not present

## 2017-04-29 DIAGNOSIS — F17210 Nicotine dependence, cigarettes, uncomplicated: Secondary | ICD-10-CM | POA: Diagnosis not present

## 2017-04-29 DIAGNOSIS — A319 Mycobacterial infection, unspecified: Secondary | ICD-10-CM | POA: Diagnosis not present

## 2017-04-29 DIAGNOSIS — R042 Hemoptysis: Secondary | ICD-10-CM | POA: Diagnosis not present

## 2017-05-13 DIAGNOSIS — J479 Bronchiectasis, uncomplicated: Secondary | ICD-10-CM | POA: Diagnosis not present

## 2017-05-18 DIAGNOSIS — J449 Chronic obstructive pulmonary disease, unspecified: Secondary | ICD-10-CM | POA: Diagnosis not present

## 2017-05-19 DIAGNOSIS — H2511 Age-related nuclear cataract, right eye: Secondary | ICD-10-CM | POA: Diagnosis not present

## 2017-05-20 DIAGNOSIS — A310 Pulmonary mycobacterial infection: Secondary | ICD-10-CM | POA: Diagnosis not present

## 2017-05-20 DIAGNOSIS — Z79899 Other long term (current) drug therapy: Secondary | ICD-10-CM | POA: Diagnosis not present

## 2017-05-20 DIAGNOSIS — Z9889 Other specified postprocedural states: Secondary | ICD-10-CM | POA: Diagnosis not present

## 2017-05-20 DIAGNOSIS — J432 Centrilobular emphysema: Secondary | ICD-10-CM | POA: Diagnosis not present

## 2017-05-20 DIAGNOSIS — D649 Anemia, unspecified: Secondary | ICD-10-CM | POA: Diagnosis not present

## 2017-05-20 DIAGNOSIS — I10 Essential (primary) hypertension: Secondary | ICD-10-CM | POA: Diagnosis not present

## 2017-05-20 DIAGNOSIS — M4624 Osteomyelitis of vertebra, thoracic region: Secondary | ICD-10-CM | POA: Diagnosis not present

## 2017-05-20 DIAGNOSIS — M818 Other osteoporosis without current pathological fracture: Secondary | ICD-10-CM | POA: Diagnosis not present

## 2017-05-20 DIAGNOSIS — K210 Gastro-esophageal reflux disease with esophagitis: Secondary | ICD-10-CM | POA: Diagnosis not present

## 2017-06-10 DIAGNOSIS — H25811 Combined forms of age-related cataract, right eye: Secondary | ICD-10-CM | POA: Diagnosis not present

## 2017-06-10 DIAGNOSIS — H2511 Age-related nuclear cataract, right eye: Secondary | ICD-10-CM | POA: Diagnosis not present

## 2017-06-13 DIAGNOSIS — J479 Bronchiectasis, uncomplicated: Secondary | ICD-10-CM | POA: Diagnosis not present

## 2017-06-18 DIAGNOSIS — J449 Chronic obstructive pulmonary disease, unspecified: Secondary | ICD-10-CM | POA: Diagnosis not present

## 2017-06-24 DIAGNOSIS — J439 Emphysema, unspecified: Secondary | ICD-10-CM | POA: Diagnosis not present

## 2017-06-24 DIAGNOSIS — R05 Cough: Secondary | ICD-10-CM | POA: Diagnosis not present

## 2017-06-24 DIAGNOSIS — M4644 Discitis, unspecified, thoracic region: Secondary | ICD-10-CM | POA: Diagnosis not present

## 2017-06-24 DIAGNOSIS — J449 Chronic obstructive pulmonary disease, unspecified: Secondary | ICD-10-CM | POA: Diagnosis not present

## 2017-06-24 DIAGNOSIS — R0602 Shortness of breath: Secondary | ICD-10-CM | POA: Diagnosis not present

## 2017-07-13 DIAGNOSIS — G8929 Other chronic pain: Secondary | ICD-10-CM | POA: Diagnosis not present

## 2017-07-13 DIAGNOSIS — Z79891 Long term (current) use of opiate analgesic: Secondary | ICD-10-CM | POA: Diagnosis not present

## 2017-07-13 DIAGNOSIS — M545 Low back pain: Secondary | ICD-10-CM | POA: Diagnosis not present

## 2017-07-13 DIAGNOSIS — M546 Pain in thoracic spine: Secondary | ICD-10-CM | POA: Diagnosis not present

## 2017-07-13 DIAGNOSIS — M4644 Discitis, unspecified, thoracic region: Secondary | ICD-10-CM | POA: Diagnosis not present

## 2017-07-14 DIAGNOSIS — J479 Bronchiectasis, uncomplicated: Secondary | ICD-10-CM | POA: Diagnosis not present

## 2017-07-16 DIAGNOSIS — A310 Pulmonary mycobacterial infection: Secondary | ICD-10-CM | POA: Diagnosis not present

## 2017-07-19 DIAGNOSIS — J449 Chronic obstructive pulmonary disease, unspecified: Secondary | ICD-10-CM | POA: Diagnosis not present

## 2017-07-21 DIAGNOSIS — F419 Anxiety disorder, unspecified: Secondary | ICD-10-CM | POA: Diagnosis not present

## 2017-07-21 DIAGNOSIS — J449 Chronic obstructive pulmonary disease, unspecified: Secondary | ICD-10-CM | POA: Diagnosis not present

## 2017-07-21 DIAGNOSIS — A310 Pulmonary mycobacterial infection: Secondary | ICD-10-CM | POA: Diagnosis not present

## 2017-07-21 DIAGNOSIS — J479 Bronchiectasis, uncomplicated: Secondary | ICD-10-CM | POA: Diagnosis not present

## 2017-08-06 DIAGNOSIS — J449 Chronic obstructive pulmonary disease, unspecified: Secondary | ICD-10-CM | POA: Diagnosis not present

## 2017-08-06 DIAGNOSIS — J209 Acute bronchitis, unspecified: Secondary | ICD-10-CM | POA: Diagnosis not present

## 2017-08-13 DIAGNOSIS — J479 Bronchiectasis, uncomplicated: Secondary | ICD-10-CM | POA: Diagnosis not present

## 2017-08-18 DIAGNOSIS — J449 Chronic obstructive pulmonary disease, unspecified: Secondary | ICD-10-CM | POA: Diagnosis not present

## 2017-09-12 DIAGNOSIS — H2701 Aphakia, right eye: Secondary | ICD-10-CM | POA: Diagnosis not present

## 2017-09-12 DIAGNOSIS — H2512 Age-related nuclear cataract, left eye: Secondary | ICD-10-CM | POA: Diagnosis not present

## 2017-09-13 DIAGNOSIS — J479 Bronchiectasis, uncomplicated: Secondary | ICD-10-CM | POA: Diagnosis not present

## 2017-09-18 DIAGNOSIS — J449 Chronic obstructive pulmonary disease, unspecified: Secondary | ICD-10-CM | POA: Diagnosis not present

## 2017-10-07 DIAGNOSIS — M546 Pain in thoracic spine: Secondary | ICD-10-CM | POA: Diagnosis not present

## 2017-10-07 DIAGNOSIS — G8929 Other chronic pain: Secondary | ICD-10-CM | POA: Diagnosis not present

## 2017-10-13 DIAGNOSIS — J479 Bronchiectasis, uncomplicated: Secondary | ICD-10-CM | POA: Diagnosis not present

## 2017-10-18 DIAGNOSIS — J449 Chronic obstructive pulmonary disease, unspecified: Secondary | ICD-10-CM | POA: Diagnosis not present

## 2017-10-30 DIAGNOSIS — A319 Mycobacterial infection, unspecified: Secondary | ICD-10-CM | POA: Diagnosis not present

## 2017-10-30 DIAGNOSIS — J449 Chronic obstructive pulmonary disease, unspecified: Secondary | ICD-10-CM | POA: Diagnosis not present

## 2017-10-30 DIAGNOSIS — R05 Cough: Secondary | ICD-10-CM | POA: Diagnosis not present

## 2017-11-18 DIAGNOSIS — J449 Chronic obstructive pulmonary disease, unspecified: Secondary | ICD-10-CM | POA: Diagnosis not present

## 2017-11-19 DIAGNOSIS — A310 Pulmonary mycobacterial infection: Secondary | ICD-10-CM | POA: Diagnosis not present

## 2017-11-19 DIAGNOSIS — M4644 Discitis, unspecified, thoracic region: Secondary | ICD-10-CM | POA: Diagnosis not present

## 2017-12-10 DIAGNOSIS — A310 Pulmonary mycobacterial infection: Secondary | ICD-10-CM | POA: Diagnosis not present

## 2017-12-19 DIAGNOSIS — J449 Chronic obstructive pulmonary disease, unspecified: Secondary | ICD-10-CM | POA: Diagnosis not present

## 2018-01-05 DIAGNOSIS — A319 Mycobacterial infection, unspecified: Secondary | ICD-10-CM | POA: Diagnosis not present

## 2018-01-05 DIAGNOSIS — R0600 Dyspnea, unspecified: Secondary | ICD-10-CM | POA: Diagnosis not present

## 2018-01-05 DIAGNOSIS — J449 Chronic obstructive pulmonary disease, unspecified: Secondary | ICD-10-CM | POA: Diagnosis not present

## 2018-01-06 DIAGNOSIS — M545 Low back pain: Secondary | ICD-10-CM | POA: Diagnosis not present

## 2018-01-06 DIAGNOSIS — M546 Pain in thoracic spine: Secondary | ICD-10-CM | POA: Diagnosis not present

## 2018-01-06 DIAGNOSIS — Z79891 Long term (current) use of opiate analgesic: Secondary | ICD-10-CM | POA: Diagnosis not present

## 2018-01-06 DIAGNOSIS — G8929 Other chronic pain: Secondary | ICD-10-CM | POA: Diagnosis not present

## 2018-01-06 DIAGNOSIS — M4644 Discitis, unspecified, thoracic region: Secondary | ICD-10-CM | POA: Diagnosis not present

## 2018-01-14 DIAGNOSIS — R0600 Dyspnea, unspecified: Secondary | ICD-10-CM | POA: Diagnosis not present

## 2018-01-16 DIAGNOSIS — J449 Chronic obstructive pulmonary disease, unspecified: Secondary | ICD-10-CM | POA: Diagnosis not present

## 2018-01-20 DIAGNOSIS — M818 Other osteoporosis without current pathological fracture: Secondary | ICD-10-CM | POA: Diagnosis not present

## 2018-01-20 DIAGNOSIS — S32020A Wedge compression fracture of second lumbar vertebra, initial encounter for closed fracture: Secondary | ICD-10-CM | POA: Diagnosis not present

## 2018-01-20 DIAGNOSIS — A310 Pulmonary mycobacterial infection: Secondary | ICD-10-CM | POA: Diagnosis not present

## 2018-01-20 DIAGNOSIS — M4624 Osteomyelitis of vertebra, thoracic region: Secondary | ICD-10-CM | POA: Diagnosis not present

## 2018-01-20 DIAGNOSIS — R05 Cough: Secondary | ICD-10-CM | POA: Diagnosis not present

## 2018-01-20 DIAGNOSIS — D649 Anemia, unspecified: Secondary | ICD-10-CM | POA: Diagnosis not present

## 2018-01-20 DIAGNOSIS — K210 Gastro-esophageal reflux disease with esophagitis: Secondary | ICD-10-CM | POA: Diagnosis not present

## 2018-01-20 DIAGNOSIS — I10 Essential (primary) hypertension: Secondary | ICD-10-CM | POA: Diagnosis not present

## 2018-01-20 DIAGNOSIS — J479 Bronchiectasis, uncomplicated: Secondary | ICD-10-CM | POA: Diagnosis not present

## 2018-01-20 DIAGNOSIS — Z79899 Other long term (current) drug therapy: Secondary | ICD-10-CM | POA: Diagnosis not present

## 2018-01-20 DIAGNOSIS — J209 Acute bronchitis, unspecified: Secondary | ICD-10-CM | POA: Diagnosis not present

## 2018-01-20 DIAGNOSIS — R918 Other nonspecific abnormal finding of lung field: Secondary | ICD-10-CM | POA: Diagnosis not present

## 2018-01-20 DIAGNOSIS — Z85118 Personal history of other malignant neoplasm of bronchus and lung: Secondary | ICD-10-CM | POA: Diagnosis not present

## 2018-01-20 DIAGNOSIS — J449 Chronic obstructive pulmonary disease, unspecified: Secondary | ICD-10-CM | POA: Diagnosis not present

## 2018-01-20 DIAGNOSIS — J189 Pneumonia, unspecified organism: Secondary | ICD-10-CM | POA: Diagnosis not present

## 2018-01-20 DIAGNOSIS — R042 Hemoptysis: Secondary | ICD-10-CM | POA: Diagnosis not present

## 2018-01-20 DIAGNOSIS — J432 Centrilobular emphysema: Secondary | ICD-10-CM | POA: Diagnosis not present

## 2018-01-20 DIAGNOSIS — J929 Pleural plaque without asbestos: Secondary | ICD-10-CM | POA: Diagnosis not present

## 2018-02-16 DIAGNOSIS — J449 Chronic obstructive pulmonary disease, unspecified: Secondary | ICD-10-CM | POA: Diagnosis not present

## 2018-02-17 DIAGNOSIS — F17210 Nicotine dependence, cigarettes, uncomplicated: Secondary | ICD-10-CM | POA: Diagnosis not present

## 2018-02-17 DIAGNOSIS — J449 Chronic obstructive pulmonary disease, unspecified: Secondary | ICD-10-CM | POA: Diagnosis not present

## 2018-02-17 DIAGNOSIS — A319 Mycobacterial infection, unspecified: Secondary | ICD-10-CM | POA: Diagnosis not present

## 2018-02-17 DIAGNOSIS — J479 Bronchiectasis, uncomplicated: Secondary | ICD-10-CM | POA: Diagnosis not present

## 2018-02-19 DIAGNOSIS — J449 Chronic obstructive pulmonary disease, unspecified: Secondary | ICD-10-CM | POA: Diagnosis not present

## 2018-02-23 DIAGNOSIS — J449 Chronic obstructive pulmonary disease, unspecified: Secondary | ICD-10-CM | POA: Diagnosis not present

## 2018-03-18 DIAGNOSIS — A310 Pulmonary mycobacterial infection: Secondary | ICD-10-CM | POA: Diagnosis not present

## 2018-03-18 DIAGNOSIS — M4644 Discitis, unspecified, thoracic region: Secondary | ICD-10-CM | POA: Diagnosis not present

## 2018-03-18 DIAGNOSIS — J449 Chronic obstructive pulmonary disease, unspecified: Secondary | ICD-10-CM | POA: Diagnosis not present

## 2018-03-18 DIAGNOSIS — R042 Hemoptysis: Secondary | ICD-10-CM | POA: Diagnosis not present

## 2018-04-05 DIAGNOSIS — M545 Low back pain: Secondary | ICD-10-CM | POA: Diagnosis not present

## 2018-04-05 DIAGNOSIS — G8929 Other chronic pain: Secondary | ICD-10-CM | POA: Diagnosis not present

## 2018-04-05 DIAGNOSIS — M546 Pain in thoracic spine: Secondary | ICD-10-CM | POA: Diagnosis not present

## 2018-04-05 DIAGNOSIS — M4644 Discitis, unspecified, thoracic region: Secondary | ICD-10-CM | POA: Diagnosis not present

## 2018-04-18 DIAGNOSIS — J449 Chronic obstructive pulmonary disease, unspecified: Secondary | ICD-10-CM | POA: Diagnosis not present

## 2018-05-17 DIAGNOSIS — J449 Chronic obstructive pulmonary disease, unspecified: Secondary | ICD-10-CM | POA: Diagnosis not present

## 2018-05-18 DIAGNOSIS — R2230 Localized swelling, mass and lump, unspecified upper limb: Secondary | ICD-10-CM | POA: Diagnosis not present

## 2018-05-18 DIAGNOSIS — J449 Chronic obstructive pulmonary disease, unspecified: Secondary | ICD-10-CM | POA: Diagnosis not present

## 2018-05-18 DIAGNOSIS — F419 Anxiety disorder, unspecified: Secondary | ICD-10-CM | POA: Diagnosis not present

## 2018-05-18 DIAGNOSIS — Z125 Encounter for screening for malignant neoplasm of prostate: Secondary | ICD-10-CM | POA: Diagnosis not present

## 2018-05-18 DIAGNOSIS — D649 Anemia, unspecified: Secondary | ICD-10-CM | POA: Diagnosis not present

## 2018-05-18 DIAGNOSIS — K210 Gastro-esophageal reflux disease with esophagitis: Secondary | ICD-10-CM | POA: Diagnosis not present

## 2018-05-18 DIAGNOSIS — Z79899 Other long term (current) drug therapy: Secondary | ICD-10-CM | POA: Diagnosis not present

## 2018-05-18 DIAGNOSIS — A310 Pulmonary mycobacterial infection: Secondary | ICD-10-CM | POA: Diagnosis not present

## 2018-05-18 DIAGNOSIS — M818 Other osteoporosis without current pathological fracture: Secondary | ICD-10-CM | POA: Diagnosis not present

## 2018-05-18 DIAGNOSIS — J432 Centrilobular emphysema: Secondary | ICD-10-CM | POA: Diagnosis not present

## 2018-05-18 DIAGNOSIS — I10 Essential (primary) hypertension: Secondary | ICD-10-CM | POA: Diagnosis not present

## 2018-06-18 DIAGNOSIS — J449 Chronic obstructive pulmonary disease, unspecified: Secondary | ICD-10-CM | POA: Diagnosis not present

## 2018-06-24 DIAGNOSIS — R0602 Shortness of breath: Secondary | ICD-10-CM | POA: Diagnosis not present

## 2018-06-24 DIAGNOSIS — R05 Cough: Secondary | ICD-10-CM | POA: Diagnosis not present

## 2018-06-24 DIAGNOSIS — J479 Bronchiectasis, uncomplicated: Secondary | ICD-10-CM | POA: Diagnosis not present

## 2018-06-29 DIAGNOSIS — R0602 Shortness of breath: Secondary | ICD-10-CM | POA: Diagnosis not present

## 2018-06-29 DIAGNOSIS — R05 Cough: Secondary | ICD-10-CM | POA: Diagnosis not present

## 2018-06-29 DIAGNOSIS — J479 Bronchiectasis, uncomplicated: Secondary | ICD-10-CM | POA: Diagnosis not present

## 2018-07-01 DIAGNOSIS — J479 Bronchiectasis, uncomplicated: Secondary | ICD-10-CM | POA: Diagnosis not present

## 2018-07-01 DIAGNOSIS — R0602 Shortness of breath: Secondary | ICD-10-CM | POA: Diagnosis not present

## 2018-07-01 DIAGNOSIS — R05 Cough: Secondary | ICD-10-CM | POA: Diagnosis not present

## 2018-07-02 DIAGNOSIS — Z79891 Long term (current) use of opiate analgesic: Secondary | ICD-10-CM | POA: Diagnosis not present

## 2018-07-02 DIAGNOSIS — Z5181 Encounter for therapeutic drug level monitoring: Secondary | ICD-10-CM | POA: Diagnosis not present

## 2018-07-02 DIAGNOSIS — M545 Low back pain: Secondary | ICD-10-CM | POA: Diagnosis not present

## 2018-07-02 DIAGNOSIS — M546 Pain in thoracic spine: Secondary | ICD-10-CM | POA: Diagnosis not present

## 2018-07-02 DIAGNOSIS — G8929 Other chronic pain: Secondary | ICD-10-CM | POA: Diagnosis not present

## 2018-07-02 DIAGNOSIS — M4644 Discitis, unspecified, thoracic region: Secondary | ICD-10-CM | POA: Diagnosis not present

## 2018-07-06 DIAGNOSIS — R05 Cough: Secondary | ICD-10-CM | POA: Diagnosis not present

## 2018-07-06 DIAGNOSIS — R0602 Shortness of breath: Secondary | ICD-10-CM | POA: Diagnosis not present

## 2018-07-06 DIAGNOSIS — J479 Bronchiectasis, uncomplicated: Secondary | ICD-10-CM | POA: Diagnosis not present

## 2018-07-13 DIAGNOSIS — J479 Bronchiectasis, uncomplicated: Secondary | ICD-10-CM | POA: Diagnosis not present

## 2018-07-13 DIAGNOSIS — R05 Cough: Secondary | ICD-10-CM | POA: Diagnosis not present

## 2018-07-13 DIAGNOSIS — R0602 Shortness of breath: Secondary | ICD-10-CM | POA: Diagnosis not present

## 2018-07-15 DIAGNOSIS — A310 Pulmonary mycobacterial infection: Secondary | ICD-10-CM | POA: Diagnosis not present

## 2018-07-15 DIAGNOSIS — R05 Cough: Secondary | ICD-10-CM | POA: Diagnosis not present

## 2018-07-15 DIAGNOSIS — J479 Bronchiectasis, uncomplicated: Secondary | ICD-10-CM | POA: Diagnosis not present

## 2018-07-15 DIAGNOSIS — M4644 Discitis, unspecified, thoracic region: Secondary | ICD-10-CM | POA: Diagnosis not present

## 2018-07-15 DIAGNOSIS — J418 Mixed simple and mucopurulent chronic bronchitis: Secondary | ICD-10-CM | POA: Diagnosis not present

## 2018-07-15 DIAGNOSIS — R0602 Shortness of breath: Secondary | ICD-10-CM | POA: Diagnosis not present

## 2018-07-19 DIAGNOSIS — J449 Chronic obstructive pulmonary disease, unspecified: Secondary | ICD-10-CM | POA: Diagnosis not present

## 2018-07-19 DIAGNOSIS — F17210 Nicotine dependence, cigarettes, uncomplicated: Secondary | ICD-10-CM | POA: Diagnosis not present

## 2018-07-19 DIAGNOSIS — A319 Mycobacterial infection, unspecified: Secondary | ICD-10-CM | POA: Diagnosis not present

## 2018-07-20 DIAGNOSIS — R05 Cough: Secondary | ICD-10-CM | POA: Diagnosis not present

## 2018-07-20 DIAGNOSIS — J479 Bronchiectasis, uncomplicated: Secondary | ICD-10-CM | POA: Diagnosis not present

## 2018-07-20 DIAGNOSIS — R0602 Shortness of breath: Secondary | ICD-10-CM | POA: Diagnosis not present

## 2018-07-27 DIAGNOSIS — R05 Cough: Secondary | ICD-10-CM | POA: Diagnosis not present

## 2018-07-27 DIAGNOSIS — J479 Bronchiectasis, uncomplicated: Secondary | ICD-10-CM | POA: Diagnosis not present

## 2018-07-27 DIAGNOSIS — R0602 Shortness of breath: Secondary | ICD-10-CM | POA: Diagnosis not present

## 2018-07-29 DIAGNOSIS — R0602 Shortness of breath: Secondary | ICD-10-CM | POA: Diagnosis not present

## 2018-07-29 DIAGNOSIS — J479 Bronchiectasis, uncomplicated: Secondary | ICD-10-CM | POA: Diagnosis not present

## 2018-07-29 DIAGNOSIS — R05 Cough: Secondary | ICD-10-CM | POA: Diagnosis not present

## 2018-08-03 DIAGNOSIS — R0989 Other specified symptoms and signs involving the circulatory and respiratory systems: Secondary | ICD-10-CM | POA: Diagnosis not present

## 2018-08-03 DIAGNOSIS — R05 Cough: Secondary | ICD-10-CM | POA: Diagnosis not present

## 2018-08-03 DIAGNOSIS — J479 Bronchiectasis, uncomplicated: Secondary | ICD-10-CM | POA: Diagnosis not present

## 2018-08-03 DIAGNOSIS — R0602 Shortness of breath: Secondary | ICD-10-CM | POA: Diagnosis not present

## 2018-08-10 DIAGNOSIS — R0602 Shortness of breath: Secondary | ICD-10-CM | POA: Diagnosis not present

## 2018-08-10 DIAGNOSIS — J479 Bronchiectasis, uncomplicated: Secondary | ICD-10-CM | POA: Diagnosis not present

## 2018-08-10 DIAGNOSIS — R05 Cough: Secondary | ICD-10-CM | POA: Diagnosis not present

## 2018-08-12 DIAGNOSIS — R05 Cough: Secondary | ICD-10-CM | POA: Diagnosis not present

## 2018-08-12 DIAGNOSIS — R0602 Shortness of breath: Secondary | ICD-10-CM | POA: Diagnosis not present

## 2018-08-12 DIAGNOSIS — J479 Bronchiectasis, uncomplicated: Secondary | ICD-10-CM | POA: Diagnosis not present

## 2018-08-18 DIAGNOSIS — J449 Chronic obstructive pulmonary disease, unspecified: Secondary | ICD-10-CM | POA: Diagnosis not present

## 2018-08-19 DIAGNOSIS — J479 Bronchiectasis, uncomplicated: Secondary | ICD-10-CM | POA: Diagnosis not present

## 2018-08-19 DIAGNOSIS — R05 Cough: Secondary | ICD-10-CM | POA: Diagnosis not present

## 2018-08-19 DIAGNOSIS — A310 Pulmonary mycobacterial infection: Secondary | ICD-10-CM | POA: Diagnosis not present

## 2018-08-19 DIAGNOSIS — M4624 Osteomyelitis of vertebra, thoracic region: Secondary | ICD-10-CM | POA: Diagnosis not present

## 2018-08-19 DIAGNOSIS — K210 Gastro-esophageal reflux disease with esophagitis: Secondary | ICD-10-CM | POA: Diagnosis not present

## 2018-08-19 DIAGNOSIS — I10 Essential (primary) hypertension: Secondary | ICD-10-CM | POA: Diagnosis not present

## 2018-08-19 DIAGNOSIS — Z1331 Encounter for screening for depression: Secondary | ICD-10-CM | POA: Diagnosis not present

## 2018-08-19 DIAGNOSIS — Z1339 Encounter for screening examination for other mental health and behavioral disorders: Secondary | ICD-10-CM | POA: Diagnosis not present

## 2018-08-19 DIAGNOSIS — Z9181 History of falling: Secondary | ICD-10-CM | POA: Diagnosis not present

## 2018-08-19 DIAGNOSIS — M818 Other osteoporosis without current pathological fracture: Secondary | ICD-10-CM | POA: Diagnosis not present

## 2018-08-19 DIAGNOSIS — R0602 Shortness of breath: Secondary | ICD-10-CM | POA: Diagnosis not present

## 2018-08-19 DIAGNOSIS — F419 Anxiety disorder, unspecified: Secondary | ICD-10-CM | POA: Diagnosis not present

## 2018-08-19 DIAGNOSIS — D649 Anemia, unspecified: Secondary | ICD-10-CM | POA: Diagnosis not present

## 2018-08-24 DIAGNOSIS — J479 Bronchiectasis, uncomplicated: Secondary | ICD-10-CM | POA: Diagnosis not present

## 2018-08-24 DIAGNOSIS — R05 Cough: Secondary | ICD-10-CM | POA: Diagnosis not present

## 2018-08-24 DIAGNOSIS — R0602 Shortness of breath: Secondary | ICD-10-CM | POA: Diagnosis not present

## 2018-08-26 DIAGNOSIS — R0602 Shortness of breath: Secondary | ICD-10-CM | POA: Diagnosis not present

## 2018-08-26 DIAGNOSIS — J479 Bronchiectasis, uncomplicated: Secondary | ICD-10-CM | POA: Diagnosis not present

## 2018-08-26 DIAGNOSIS — R05 Cough: Secondary | ICD-10-CM | POA: Diagnosis not present

## 2018-08-31 DIAGNOSIS — R05 Cough: Secondary | ICD-10-CM | POA: Diagnosis not present

## 2018-08-31 DIAGNOSIS — J479 Bronchiectasis, uncomplicated: Secondary | ICD-10-CM | POA: Diagnosis not present

## 2018-08-31 DIAGNOSIS — R0602 Shortness of breath: Secondary | ICD-10-CM | POA: Diagnosis not present

## 2018-09-09 DIAGNOSIS — J479 Bronchiectasis, uncomplicated: Secondary | ICD-10-CM | POA: Diagnosis not present

## 2018-09-09 DIAGNOSIS — R0602 Shortness of breath: Secondary | ICD-10-CM | POA: Diagnosis not present

## 2018-09-09 DIAGNOSIS — R05 Cough: Secondary | ICD-10-CM | POA: Diagnosis not present

## 2018-09-16 DIAGNOSIS — R05 Cough: Secondary | ICD-10-CM | POA: Diagnosis not present

## 2018-09-16 DIAGNOSIS — R0602 Shortness of breath: Secondary | ICD-10-CM | POA: Diagnosis not present

## 2018-09-16 DIAGNOSIS — J479 Bronchiectasis, uncomplicated: Secondary | ICD-10-CM | POA: Diagnosis not present

## 2018-09-18 DIAGNOSIS — J449 Chronic obstructive pulmonary disease, unspecified: Secondary | ICD-10-CM | POA: Diagnosis not present

## 2018-09-21 DIAGNOSIS — R0602 Shortness of breath: Secondary | ICD-10-CM | POA: Diagnosis not present

## 2018-09-21 DIAGNOSIS — J479 Bronchiectasis, uncomplicated: Secondary | ICD-10-CM | POA: Diagnosis not present

## 2018-09-21 DIAGNOSIS — R05 Cough: Secondary | ICD-10-CM | POA: Diagnosis not present

## 2018-09-24 DIAGNOSIS — G8929 Other chronic pain: Secondary | ICD-10-CM | POA: Diagnosis not present

## 2018-09-24 DIAGNOSIS — Z79891 Long term (current) use of opiate analgesic: Secondary | ICD-10-CM | POA: Diagnosis not present

## 2018-09-24 DIAGNOSIS — M545 Low back pain: Secondary | ICD-10-CM | POA: Diagnosis not present

## 2018-09-24 DIAGNOSIS — M9981 Other biomechanical lesions of cervical region: Secondary | ICD-10-CM | POA: Diagnosis not present

## 2018-09-24 DIAGNOSIS — M4644 Discitis, unspecified, thoracic region: Secondary | ICD-10-CM | POA: Diagnosis not present

## 2018-09-24 DIAGNOSIS — M5412 Radiculopathy, cervical region: Secondary | ICD-10-CM | POA: Diagnosis not present

## 2018-09-24 DIAGNOSIS — M546 Pain in thoracic spine: Secondary | ICD-10-CM | POA: Diagnosis not present

## 2018-09-24 DIAGNOSIS — R202 Paresthesia of skin: Secondary | ICD-10-CM | POA: Diagnosis not present

## 2018-09-24 DIAGNOSIS — M4802 Spinal stenosis, cervical region: Secondary | ICD-10-CM | POA: Diagnosis not present

## 2018-09-27 DIAGNOSIS — R05 Cough: Secondary | ICD-10-CM | POA: Diagnosis not present

## 2018-09-27 DIAGNOSIS — R0602 Shortness of breath: Secondary | ICD-10-CM | POA: Diagnosis not present

## 2018-09-27 DIAGNOSIS — J479 Bronchiectasis, uncomplicated: Secondary | ICD-10-CM | POA: Diagnosis not present

## 2018-10-05 DIAGNOSIS — R0602 Shortness of breath: Secondary | ICD-10-CM | POA: Diagnosis not present

## 2018-10-05 DIAGNOSIS — J479 Bronchiectasis, uncomplicated: Secondary | ICD-10-CM | POA: Diagnosis not present

## 2018-10-05 DIAGNOSIS — R05 Cough: Secondary | ICD-10-CM | POA: Diagnosis not present

## 2018-10-07 DIAGNOSIS — R0602 Shortness of breath: Secondary | ICD-10-CM | POA: Diagnosis not present

## 2018-10-07 DIAGNOSIS — J479 Bronchiectasis, uncomplicated: Secondary | ICD-10-CM | POA: Diagnosis not present

## 2018-10-07 DIAGNOSIS — R05 Cough: Secondary | ICD-10-CM | POA: Diagnosis not present

## 2018-10-11 DIAGNOSIS — R0600 Dyspnea, unspecified: Secondary | ICD-10-CM | POA: Diagnosis not present

## 2018-10-11 DIAGNOSIS — R0902 Hypoxemia: Secondary | ICD-10-CM | POA: Diagnosis not present

## 2018-10-11 DIAGNOSIS — A319 Mycobacterial infection, unspecified: Secondary | ICD-10-CM | POA: Diagnosis not present

## 2018-10-11 DIAGNOSIS — F17210 Nicotine dependence, cigarettes, uncomplicated: Secondary | ICD-10-CM | POA: Diagnosis not present

## 2018-10-11 DIAGNOSIS — J449 Chronic obstructive pulmonary disease, unspecified: Secondary | ICD-10-CM | POA: Diagnosis not present

## 2018-10-11 DIAGNOSIS — R05 Cough: Secondary | ICD-10-CM | POA: Diagnosis not present

## 2018-10-12 DIAGNOSIS — R05 Cough: Secondary | ICD-10-CM | POA: Diagnosis not present

## 2018-10-12 DIAGNOSIS — R0602 Shortness of breath: Secondary | ICD-10-CM | POA: Diagnosis not present

## 2018-10-12 DIAGNOSIS — J449 Chronic obstructive pulmonary disease, unspecified: Secondary | ICD-10-CM | POA: Diagnosis not present

## 2018-10-12 DIAGNOSIS — J479 Bronchiectasis, uncomplicated: Secondary | ICD-10-CM | POA: Diagnosis not present

## 2018-10-18 DIAGNOSIS — J449 Chronic obstructive pulmonary disease, unspecified: Secondary | ICD-10-CM | POA: Diagnosis not present

## 2018-11-02 DIAGNOSIS — R05 Cough: Secondary | ICD-10-CM | POA: Diagnosis not present

## 2018-11-02 DIAGNOSIS — J479 Bronchiectasis, uncomplicated: Secondary | ICD-10-CM | POA: Diagnosis not present

## 2018-11-02 DIAGNOSIS — R0602 Shortness of breath: Secondary | ICD-10-CM | POA: Diagnosis not present

## 2018-11-04 DIAGNOSIS — J479 Bronchiectasis, uncomplicated: Secondary | ICD-10-CM | POA: Diagnosis not present

## 2018-11-04 DIAGNOSIS — R05 Cough: Secondary | ICD-10-CM | POA: Diagnosis not present

## 2018-11-04 DIAGNOSIS — R0602 Shortness of breath: Secondary | ICD-10-CM | POA: Diagnosis not present

## 2018-11-09 DIAGNOSIS — R05 Cough: Secondary | ICD-10-CM | POA: Diagnosis not present

## 2018-11-09 DIAGNOSIS — J479 Bronchiectasis, uncomplicated: Secondary | ICD-10-CM | POA: Diagnosis not present

## 2018-11-09 DIAGNOSIS — R0602 Shortness of breath: Secondary | ICD-10-CM | POA: Diagnosis not present

## 2018-11-11 DIAGNOSIS — R05 Cough: Secondary | ICD-10-CM | POA: Diagnosis not present

## 2018-11-11 DIAGNOSIS — R0602 Shortness of breath: Secondary | ICD-10-CM | POA: Diagnosis not present

## 2018-11-11 DIAGNOSIS — J449 Chronic obstructive pulmonary disease, unspecified: Secondary | ICD-10-CM | POA: Diagnosis not present

## 2018-11-11 DIAGNOSIS — J479 Bronchiectasis, uncomplicated: Secondary | ICD-10-CM | POA: Diagnosis not present

## 2018-11-18 DIAGNOSIS — J449 Chronic obstructive pulmonary disease, unspecified: Secondary | ICD-10-CM | POA: Diagnosis not present

## 2018-11-22 DIAGNOSIS — M818 Other osteoporosis without current pathological fracture: Secondary | ICD-10-CM | POA: Diagnosis not present

## 2018-11-22 DIAGNOSIS — I10 Essential (primary) hypertension: Secondary | ICD-10-CM | POA: Diagnosis not present

## 2018-11-22 DIAGNOSIS — A310 Pulmonary mycobacterial infection: Secondary | ICD-10-CM | POA: Diagnosis not present

## 2018-11-22 DIAGNOSIS — K210 Gastro-esophageal reflux disease with esophagitis: Secondary | ICD-10-CM | POA: Diagnosis not present

## 2018-11-22 DIAGNOSIS — M8580 Other specified disorders of bone density and structure, unspecified site: Secondary | ICD-10-CM | POA: Diagnosis not present

## 2018-11-22 DIAGNOSIS — M4624 Osteomyelitis of vertebra, thoracic region: Secondary | ICD-10-CM | POA: Diagnosis not present

## 2018-11-22 DIAGNOSIS — D649 Anemia, unspecified: Secondary | ICD-10-CM | POA: Diagnosis not present

## 2018-11-22 DIAGNOSIS — R110 Nausea: Secondary | ICD-10-CM | POA: Diagnosis not present

## 2018-11-22 DIAGNOSIS — J432 Centrilobular emphysema: Secondary | ICD-10-CM | POA: Diagnosis not present

## 2018-12-08 DIAGNOSIS — J449 Chronic obstructive pulmonary disease, unspecified: Secondary | ICD-10-CM | POA: Diagnosis not present

## 2018-12-14 DIAGNOSIS — J479 Bronchiectasis, uncomplicated: Secondary | ICD-10-CM | POA: Diagnosis not present

## 2018-12-14 DIAGNOSIS — R0602 Shortness of breath: Secondary | ICD-10-CM | POA: Diagnosis not present

## 2018-12-16 DIAGNOSIS — R0602 Shortness of breath: Secondary | ICD-10-CM | POA: Diagnosis not present

## 2018-12-16 DIAGNOSIS — M810 Age-related osteoporosis without current pathological fracture: Secondary | ICD-10-CM | POA: Diagnosis not present

## 2018-12-16 DIAGNOSIS — A310 Pulmonary mycobacterial infection: Secondary | ICD-10-CM | POA: Diagnosis not present

## 2018-12-16 DIAGNOSIS — M818 Other osteoporosis without current pathological fracture: Secondary | ICD-10-CM | POA: Diagnosis not present

## 2018-12-16 DIAGNOSIS — J479 Bronchiectasis, uncomplicated: Secondary | ICD-10-CM | POA: Diagnosis not present

## 2018-12-16 DIAGNOSIS — M4644 Discitis, unspecified, thoracic region: Secondary | ICD-10-CM | POA: Diagnosis not present

## 2018-12-19 DIAGNOSIS — J449 Chronic obstructive pulmonary disease, unspecified: Secondary | ICD-10-CM | POA: Diagnosis not present

## 2018-12-24 DIAGNOSIS — M545 Low back pain: Secondary | ICD-10-CM | POA: Diagnosis not present

## 2018-12-24 DIAGNOSIS — Z79891 Long term (current) use of opiate analgesic: Secondary | ICD-10-CM | POA: Diagnosis not present

## 2018-12-24 DIAGNOSIS — G8929 Other chronic pain: Secondary | ICD-10-CM | POA: Diagnosis not present

## 2018-12-24 DIAGNOSIS — M546 Pain in thoracic spine: Secondary | ICD-10-CM | POA: Diagnosis not present

## 2019-01-04 DIAGNOSIS — R0602 Shortness of breath: Secondary | ICD-10-CM | POA: Diagnosis not present

## 2019-01-04 DIAGNOSIS — J479 Bronchiectasis, uncomplicated: Secondary | ICD-10-CM | POA: Diagnosis not present

## 2019-01-06 DIAGNOSIS — J479 Bronchiectasis, uncomplicated: Secondary | ICD-10-CM | POA: Diagnosis not present

## 2019-01-06 DIAGNOSIS — R0602 Shortness of breath: Secondary | ICD-10-CM | POA: Diagnosis not present

## 2019-01-17 DIAGNOSIS — J449 Chronic obstructive pulmonary disease, unspecified: Secondary | ICD-10-CM | POA: Diagnosis not present

## 2019-01-18 DIAGNOSIS — J479 Bronchiectasis, uncomplicated: Secondary | ICD-10-CM | POA: Diagnosis not present

## 2019-01-18 DIAGNOSIS — R0602 Shortness of breath: Secondary | ICD-10-CM | POA: Diagnosis not present

## 2019-01-19 DIAGNOSIS — R0602 Shortness of breath: Secondary | ICD-10-CM | POA: Diagnosis not present

## 2019-01-19 DIAGNOSIS — J479 Bronchiectasis, uncomplicated: Secondary | ICD-10-CM | POA: Diagnosis not present

## 2019-02-15 DIAGNOSIS — J449 Chronic obstructive pulmonary disease, unspecified: Secondary | ICD-10-CM | POA: Diagnosis not present

## 2019-02-15 DIAGNOSIS — I10 Essential (primary) hypertension: Secondary | ICD-10-CM | POA: Diagnosis not present

## 2019-02-15 DIAGNOSIS — E559 Vitamin D deficiency, unspecified: Secondary | ICD-10-CM | POA: Diagnosis not present

## 2019-02-15 DIAGNOSIS — F4321 Adjustment disorder with depressed mood: Secondary | ICD-10-CM | POA: Diagnosis not present

## 2019-02-15 DIAGNOSIS — M47816 Spondylosis without myelopathy or radiculopathy, lumbar region: Secondary | ICD-10-CM | POA: Diagnosis not present

## 2019-02-15 DIAGNOSIS — A310 Pulmonary mycobacterial infection: Secondary | ICD-10-CM | POA: Diagnosis not present

## 2019-02-15 DIAGNOSIS — E1142 Type 2 diabetes mellitus with diabetic polyneuropathy: Secondary | ICD-10-CM | POA: Diagnosis not present

## 2019-02-15 DIAGNOSIS — K219 Gastro-esophageal reflux disease without esophagitis: Secondary | ICD-10-CM | POA: Diagnosis not present

## 2019-02-17 DIAGNOSIS — J449 Chronic obstructive pulmonary disease, unspecified: Secondary | ICD-10-CM | POA: Diagnosis not present

## 2019-03-01 DIAGNOSIS — M4624 Osteomyelitis of vertebra, thoracic region: Secondary | ICD-10-CM | POA: Diagnosis not present

## 2019-03-01 DIAGNOSIS — N400 Enlarged prostate without lower urinary tract symptoms: Secondary | ICD-10-CM | POA: Diagnosis not present

## 2019-03-01 DIAGNOSIS — A310 Pulmonary mycobacterial infection: Secondary | ICD-10-CM | POA: Diagnosis not present

## 2019-03-01 DIAGNOSIS — F419 Anxiety disorder, unspecified: Secondary | ICD-10-CM | POA: Diagnosis not present

## 2019-03-01 DIAGNOSIS — K2970 Gastritis, unspecified, without bleeding: Secondary | ICD-10-CM | POA: Diagnosis not present

## 2019-03-01 DIAGNOSIS — I10 Essential (primary) hypertension: Secondary | ICD-10-CM | POA: Diagnosis not present

## 2019-03-01 DIAGNOSIS — J432 Centrilobular emphysema: Secondary | ICD-10-CM | POA: Diagnosis not present

## 2019-03-01 DIAGNOSIS — G4700 Insomnia, unspecified: Secondary | ICD-10-CM | POA: Diagnosis not present

## 2019-03-01 DIAGNOSIS — M818 Other osteoporosis without current pathological fracture: Secondary | ICD-10-CM | POA: Diagnosis not present

## 2019-03-22 DIAGNOSIS — M4644 Discitis, unspecified, thoracic region: Secondary | ICD-10-CM | POA: Diagnosis not present

## 2019-03-22 DIAGNOSIS — M546 Pain in thoracic spine: Secondary | ICD-10-CM | POA: Diagnosis not present

## 2019-03-22 DIAGNOSIS — M545 Low back pain: Secondary | ICD-10-CM | POA: Diagnosis not present

## 2019-03-31 DIAGNOSIS — A310 Pulmonary mycobacterial infection: Secondary | ICD-10-CM | POA: Diagnosis not present

## 2019-03-31 DIAGNOSIS — J418 Mixed simple and mucopurulent chronic bronchitis: Secondary | ICD-10-CM | POA: Diagnosis not present

## 2019-04-12 DIAGNOSIS — J449 Chronic obstructive pulmonary disease, unspecified: Secondary | ICD-10-CM | POA: Diagnosis not present

## 2019-04-12 DIAGNOSIS — F17210 Nicotine dependence, cigarettes, uncomplicated: Secondary | ICD-10-CM | POA: Diagnosis not present

## 2019-04-12 DIAGNOSIS — A310 Pulmonary mycobacterial infection: Secondary | ICD-10-CM | POA: Diagnosis not present

## 2019-04-12 DIAGNOSIS — J479 Bronchiectasis, uncomplicated: Secondary | ICD-10-CM | POA: Diagnosis not present

## 2019-05-31 DIAGNOSIS — I10 Essential (primary) hypertension: Secondary | ICD-10-CM | POA: Diagnosis not present

## 2019-05-31 DIAGNOSIS — K2970 Gastritis, unspecified, without bleeding: Secondary | ICD-10-CM | POA: Diagnosis not present

## 2019-05-31 DIAGNOSIS — M4624 Osteomyelitis of vertebra, thoracic region: Secondary | ICD-10-CM | POA: Diagnosis not present

## 2019-05-31 DIAGNOSIS — J432 Centrilobular emphysema: Secondary | ICD-10-CM | POA: Diagnosis not present

## 2019-05-31 DIAGNOSIS — Z79899 Other long term (current) drug therapy: Secondary | ICD-10-CM | POA: Diagnosis not present

## 2019-05-31 DIAGNOSIS — F419 Anxiety disorder, unspecified: Secondary | ICD-10-CM | POA: Diagnosis not present

## 2019-05-31 DIAGNOSIS — Z125 Encounter for screening for malignant neoplasm of prostate: Secondary | ICD-10-CM | POA: Diagnosis not present

## 2019-05-31 DIAGNOSIS — E559 Vitamin D deficiency, unspecified: Secondary | ICD-10-CM | POA: Diagnosis not present

## 2019-05-31 DIAGNOSIS — A310 Pulmonary mycobacterial infection: Secondary | ICD-10-CM | POA: Diagnosis not present

## 2019-05-31 DIAGNOSIS — Z6821 Body mass index (BMI) 21.0-21.9, adult: Secondary | ICD-10-CM | POA: Diagnosis not present

## 2019-05-31 DIAGNOSIS — N400 Enlarged prostate without lower urinary tract symptoms: Secondary | ICD-10-CM | POA: Diagnosis not present

## 2019-06-02 DIAGNOSIS — R05 Cough: Secondary | ICD-10-CM | POA: Diagnosis not present

## 2019-06-02 DIAGNOSIS — J418 Mixed simple and mucopurulent chronic bronchitis: Secondary | ICD-10-CM | POA: Diagnosis not present

## 2019-06-02 DIAGNOSIS — A310 Pulmonary mycobacterial infection: Secondary | ICD-10-CM | POA: Diagnosis not present

## 2019-06-06 DIAGNOSIS — J449 Chronic obstructive pulmonary disease, unspecified: Secondary | ICD-10-CM | POA: Diagnosis not present

## 2019-06-06 DIAGNOSIS — J479 Bronchiectasis, uncomplicated: Secondary | ICD-10-CM | POA: Diagnosis not present

## 2019-06-20 DIAGNOSIS — M545 Low back pain: Secondary | ICD-10-CM | POA: Diagnosis not present

## 2019-06-20 DIAGNOSIS — G8929 Other chronic pain: Secondary | ICD-10-CM | POA: Diagnosis not present

## 2019-06-20 DIAGNOSIS — M4644 Discitis, unspecified, thoracic region: Secondary | ICD-10-CM | POA: Diagnosis not present

## 2019-06-20 DIAGNOSIS — M546 Pain in thoracic spine: Secondary | ICD-10-CM | POA: Diagnosis not present

## 2019-07-07 DIAGNOSIS — J449 Chronic obstructive pulmonary disease, unspecified: Secondary | ICD-10-CM | POA: Diagnosis not present

## 2019-07-07 DIAGNOSIS — J479 Bronchiectasis, uncomplicated: Secondary | ICD-10-CM | POA: Diagnosis not present

## 2019-07-25 DIAGNOSIS — R05 Cough: Secondary | ICD-10-CM | POA: Diagnosis not present

## 2019-07-25 DIAGNOSIS — F17210 Nicotine dependence, cigarettes, uncomplicated: Secondary | ICD-10-CM | POA: Diagnosis not present

## 2019-07-25 DIAGNOSIS — A310 Pulmonary mycobacterial infection: Secondary | ICD-10-CM | POA: Diagnosis not present

## 2019-07-25 DIAGNOSIS — J449 Chronic obstructive pulmonary disease, unspecified: Secondary | ICD-10-CM | POA: Diagnosis not present

## 2019-07-25 DIAGNOSIS — F17200 Nicotine dependence, unspecified, uncomplicated: Secondary | ICD-10-CM | POA: Diagnosis not present

## 2019-07-27 DIAGNOSIS — F4321 Adjustment disorder with depressed mood: Secondary | ICD-10-CM | POA: Diagnosis not present

## 2019-07-27 DIAGNOSIS — I2510 Atherosclerotic heart disease of native coronary artery without angina pectoris: Secondary | ICD-10-CM | POA: Diagnosis not present

## 2019-07-27 DIAGNOSIS — E559 Vitamin D deficiency, unspecified: Secondary | ICD-10-CM | POA: Diagnosis not present

## 2019-07-27 DIAGNOSIS — A310 Pulmonary mycobacterial infection: Secondary | ICD-10-CM | POA: Diagnosis not present

## 2019-07-27 DIAGNOSIS — G4700 Insomnia, unspecified: Secondary | ICD-10-CM | POA: Diagnosis not present

## 2019-07-27 DIAGNOSIS — Z6821 Body mass index (BMI) 21.0-21.9, adult: Secondary | ICD-10-CM | POA: Diagnosis not present

## 2019-07-27 DIAGNOSIS — J449 Chronic obstructive pulmonary disease, unspecified: Secondary | ICD-10-CM | POA: Diagnosis not present

## 2019-07-27 DIAGNOSIS — I10 Essential (primary) hypertension: Secondary | ICD-10-CM | POA: Diagnosis not present

## 2019-07-27 DIAGNOSIS — M8580 Other specified disorders of bone density and structure, unspecified site: Secondary | ICD-10-CM | POA: Diagnosis not present

## 2019-07-27 DIAGNOSIS — D649 Anemia, unspecified: Secondary | ICD-10-CM | POA: Diagnosis not present

## 2019-07-27 DIAGNOSIS — F418 Other specified anxiety disorders: Secondary | ICD-10-CM | POA: Diagnosis not present

## 2019-07-27 DIAGNOSIS — N400 Enlarged prostate without lower urinary tract symptoms: Secondary | ICD-10-CM | POA: Diagnosis not present

## 2019-07-27 DIAGNOSIS — Z720 Tobacco use: Secondary | ICD-10-CM | POA: Diagnosis not present

## 2019-08-06 DIAGNOSIS — J449 Chronic obstructive pulmonary disease, unspecified: Secondary | ICD-10-CM | POA: Diagnosis not present

## 2019-08-06 DIAGNOSIS — J479 Bronchiectasis, uncomplicated: Secondary | ICD-10-CM | POA: Diagnosis not present

## 2019-09-01 DIAGNOSIS — Z23 Encounter for immunization: Secondary | ICD-10-CM | POA: Diagnosis not present

## 2019-09-01 DIAGNOSIS — M4624 Osteomyelitis of vertebra, thoracic region: Secondary | ICD-10-CM | POA: Diagnosis not present

## 2019-09-01 DIAGNOSIS — E538 Deficiency of other specified B group vitamins: Secondary | ICD-10-CM | POA: Diagnosis not present

## 2019-09-01 DIAGNOSIS — Z79899 Other long term (current) drug therapy: Secondary | ICD-10-CM | POA: Diagnosis not present

## 2019-09-01 DIAGNOSIS — E559 Vitamin D deficiency, unspecified: Secondary | ICD-10-CM | POA: Diagnosis not present

## 2019-09-01 DIAGNOSIS — I10 Essential (primary) hypertension: Secondary | ICD-10-CM | POA: Diagnosis not present

## 2019-09-01 DIAGNOSIS — J432 Centrilobular emphysema: Secondary | ICD-10-CM | POA: Diagnosis not present

## 2019-09-01 DIAGNOSIS — F419 Anxiety disorder, unspecified: Secondary | ICD-10-CM | POA: Diagnosis not present

## 2019-09-01 DIAGNOSIS — R079 Chest pain, unspecified: Secondary | ICD-10-CM | POA: Diagnosis not present

## 2019-09-06 DIAGNOSIS — J479 Bronchiectasis, uncomplicated: Secondary | ICD-10-CM | POA: Diagnosis not present

## 2019-09-06 DIAGNOSIS — J449 Chronic obstructive pulmonary disease, unspecified: Secondary | ICD-10-CM | POA: Diagnosis not present

## 2019-09-16 DIAGNOSIS — H2512 Age-related nuclear cataract, left eye: Secondary | ICD-10-CM | POA: Diagnosis not present

## 2019-09-16 DIAGNOSIS — H2701 Aphakia, right eye: Secondary | ICD-10-CM | POA: Diagnosis not present

## 2019-09-20 DIAGNOSIS — Z79891 Long term (current) use of opiate analgesic: Secondary | ICD-10-CM | POA: Diagnosis not present

## 2019-09-20 DIAGNOSIS — G8929 Other chronic pain: Secondary | ICD-10-CM | POA: Diagnosis not present

## 2019-09-20 DIAGNOSIS — M546 Pain in thoracic spine: Secondary | ICD-10-CM | POA: Diagnosis not present

## 2019-09-20 DIAGNOSIS — M545 Low back pain: Secondary | ICD-10-CM | POA: Diagnosis not present

## 2019-09-20 DIAGNOSIS — M4644 Discitis, unspecified, thoracic region: Secondary | ICD-10-CM | POA: Diagnosis not present

## 2019-09-27 DIAGNOSIS — A310 Pulmonary mycobacterial infection: Secondary | ICD-10-CM | POA: Diagnosis not present

## 2019-09-27 DIAGNOSIS — J939 Pneumothorax, unspecified: Secondary | ICD-10-CM | POA: Diagnosis not present

## 2019-09-27 DIAGNOSIS — Z03818 Encounter for observation for suspected exposure to other biological agents ruled out: Secondary | ICD-10-CM | POA: Diagnosis not present

## 2019-09-27 DIAGNOSIS — J984 Other disorders of lung: Secondary | ICD-10-CM | POA: Diagnosis not present

## 2019-09-27 DIAGNOSIS — R042 Hemoptysis: Secondary | ICD-10-CM | POA: Diagnosis not present

## 2019-09-27 DIAGNOSIS — N281 Cyst of kidney, acquired: Secondary | ICD-10-CM | POA: Diagnosis not present

## 2019-09-27 DIAGNOSIS — R0902 Hypoxemia: Secondary | ICD-10-CM | POA: Diagnosis not present

## 2019-09-27 DIAGNOSIS — G8929 Other chronic pain: Secondary | ICD-10-CM | POA: Diagnosis not present

## 2019-09-27 DIAGNOSIS — J449 Chronic obstructive pulmonary disease, unspecified: Secondary | ICD-10-CM | POA: Diagnosis not present

## 2019-09-27 DIAGNOSIS — J948 Other specified pleural conditions: Secondary | ICD-10-CM | POA: Diagnosis not present

## 2019-09-27 DIAGNOSIS — J159 Unspecified bacterial pneumonia: Secondary | ICD-10-CM | POA: Diagnosis not present

## 2019-09-27 DIAGNOSIS — R7989 Other specified abnormal findings of blood chemistry: Secondary | ICD-10-CM | POA: Diagnosis not present

## 2019-09-27 DIAGNOSIS — J9601 Acute respiratory failure with hypoxia: Secondary | ICD-10-CM | POA: Diagnosis not present

## 2019-09-27 DIAGNOSIS — I2510 Atherosclerotic heart disease of native coronary artery without angina pectoris: Secondary | ICD-10-CM | POA: Diagnosis not present

## 2019-09-27 DIAGNOSIS — M542 Cervicalgia: Secondary | ICD-10-CM | POA: Diagnosis not present

## 2019-09-27 DIAGNOSIS — D62 Acute posthemorrhagic anemia: Secondary | ICD-10-CM | POA: Diagnosis not present

## 2019-09-27 DIAGNOSIS — R05 Cough: Secondary | ICD-10-CM | POA: Diagnosis not present

## 2019-09-27 DIAGNOSIS — R0600 Dyspnea, unspecified: Secondary | ICD-10-CM | POA: Diagnosis not present

## 2019-09-27 DIAGNOSIS — J929 Pleural plaque without asbestos: Secondary | ICD-10-CM | POA: Diagnosis not present

## 2019-09-27 DIAGNOSIS — Z20828 Contact with and (suspected) exposure to other viral communicable diseases: Secondary | ICD-10-CM | POA: Diagnosis not present

## 2019-09-27 DIAGNOSIS — J441 Chronic obstructive pulmonary disease with (acute) exacerbation: Secondary | ICD-10-CM | POA: Diagnosis not present

## 2019-09-27 DIAGNOSIS — J439 Emphysema, unspecified: Secondary | ICD-10-CM | POA: Diagnosis not present

## 2019-09-27 DIAGNOSIS — Z743 Need for continuous supervision: Secondary | ICD-10-CM | POA: Diagnosis not present

## 2019-09-27 DIAGNOSIS — J9602 Acute respiratory failure with hypercapnia: Secondary | ICD-10-CM | POA: Diagnosis not present

## 2019-09-27 DIAGNOSIS — I700 Atherosclerosis of aorta: Secondary | ICD-10-CM | POA: Diagnosis not present

## 2019-09-27 DIAGNOSIS — J9621 Acute and chronic respiratory failure with hypoxia: Secondary | ICD-10-CM | POA: Diagnosis not present

## 2019-09-27 DIAGNOSIS — J432 Centrilobular emphysema: Secondary | ICD-10-CM | POA: Diagnosis not present

## 2019-09-27 DIAGNOSIS — Z720 Tobacco use: Secondary | ICD-10-CM | POA: Diagnosis not present

## 2019-09-27 DIAGNOSIS — J029 Acute pharyngitis, unspecified: Secondary | ICD-10-CM | POA: Diagnosis not present

## 2019-09-27 DIAGNOSIS — I10 Essential (primary) hypertension: Secondary | ICD-10-CM | POA: Diagnosis not present

## 2019-09-27 DIAGNOSIS — J9610 Chronic respiratory failure, unspecified whether with hypoxia or hypercapnia: Secondary | ICD-10-CM | POA: Diagnosis not present

## 2019-09-27 DIAGNOSIS — T797XXA Traumatic subcutaneous emphysema, initial encounter: Secondary | ICD-10-CM | POA: Diagnosis not present

## 2019-09-27 DIAGNOSIS — R58 Hemorrhage, not elsewhere classified: Secondary | ICD-10-CM | POA: Diagnosis not present

## 2019-09-27 DIAGNOSIS — D696 Thrombocytopenia, unspecified: Secondary | ICD-10-CM | POA: Diagnosis not present

## 2019-09-29 DIAGNOSIS — F17210 Nicotine dependence, cigarettes, uncomplicated: Secondary | ICD-10-CM | POA: Diagnosis not present

## 2019-09-29 DIAGNOSIS — D62 Acute posthemorrhagic anemia: Secondary | ICD-10-CM | POA: Diagnosis not present

## 2019-09-29 DIAGNOSIS — A310 Pulmonary mycobacterial infection: Secondary | ICD-10-CM | POA: Diagnosis not present

## 2019-09-29 DIAGNOSIS — F329 Major depressive disorder, single episode, unspecified: Secondary | ICD-10-CM | POA: Diagnosis not present

## 2019-09-29 DIAGNOSIS — J939 Pneumothorax, unspecified: Secondary | ICD-10-CM | POA: Diagnosis not present

## 2019-09-29 DIAGNOSIS — J449 Chronic obstructive pulmonary disease, unspecified: Secondary | ICD-10-CM | POA: Diagnosis not present

## 2019-09-29 DIAGNOSIS — J948 Other specified pleural conditions: Secondary | ICD-10-CM | POA: Diagnosis not present

## 2019-09-29 DIAGNOSIS — R042 Hemoptysis: Secondary | ICD-10-CM | POA: Diagnosis not present

## 2019-09-29 DIAGNOSIS — Z4682 Encounter for fitting and adjustment of non-vascular catheter: Secondary | ICD-10-CM | POA: Diagnosis not present

## 2019-09-29 DIAGNOSIS — T797XXA Traumatic subcutaneous emphysema, initial encounter: Secondary | ICD-10-CM | POA: Diagnosis not present

## 2019-09-29 DIAGNOSIS — G8929 Other chronic pain: Secondary | ICD-10-CM | POA: Diagnosis not present

## 2019-09-29 DIAGNOSIS — J479 Bronchiectasis, uncomplicated: Secondary | ICD-10-CM | POA: Diagnosis not present

## 2019-09-29 DIAGNOSIS — Z720 Tobacco use: Secondary | ICD-10-CM | POA: Diagnosis not present

## 2019-09-29 DIAGNOSIS — D696 Thrombocytopenia, unspecified: Secondary | ICD-10-CM | POA: Diagnosis not present

## 2019-09-29 DIAGNOSIS — J159 Unspecified bacterial pneumonia: Secondary | ICD-10-CM | POA: Diagnosis not present

## 2019-09-29 DIAGNOSIS — F419 Anxiety disorder, unspecified: Secondary | ICD-10-CM | POA: Diagnosis not present

## 2019-09-29 DIAGNOSIS — J432 Centrilobular emphysema: Secondary | ICD-10-CM | POA: Diagnosis not present

## 2019-09-29 DIAGNOSIS — J9610 Chronic respiratory failure, unspecified whether with hypoxia or hypercapnia: Secondary | ICD-10-CM | POA: Diagnosis not present

## 2019-09-29 DIAGNOSIS — R918 Other nonspecific abnormal finding of lung field: Secondary | ICD-10-CM | POA: Diagnosis not present

## 2019-10-11 DIAGNOSIS — J449 Chronic obstructive pulmonary disease, unspecified: Secondary | ICD-10-CM | POA: Diagnosis not present

## 2019-10-13 DIAGNOSIS — F419 Anxiety disorder, unspecified: Secondary | ICD-10-CM | POA: Diagnosis not present

## 2019-10-13 DIAGNOSIS — Z79899 Other long term (current) drug therapy: Secondary | ICD-10-CM | POA: Diagnosis not present

## 2019-10-13 DIAGNOSIS — J432 Centrilobular emphysema: Secondary | ICD-10-CM | POA: Diagnosis not present

## 2019-10-13 DIAGNOSIS — K2970 Gastritis, unspecified, without bleeding: Secondary | ICD-10-CM | POA: Diagnosis not present

## 2019-10-13 DIAGNOSIS — G4700 Insomnia, unspecified: Secondary | ICD-10-CM | POA: Diagnosis not present

## 2019-10-13 DIAGNOSIS — J159 Unspecified bacterial pneumonia: Secondary | ICD-10-CM | POA: Diagnosis not present

## 2019-10-13 DIAGNOSIS — A310 Pulmonary mycobacterial infection: Secondary | ICD-10-CM | POA: Diagnosis not present

## 2019-10-13 DIAGNOSIS — N400 Enlarged prostate without lower urinary tract symptoms: Secondary | ICD-10-CM | POA: Diagnosis not present

## 2019-10-13 DIAGNOSIS — M4624 Osteomyelitis of vertebra, thoracic region: Secondary | ICD-10-CM | POA: Diagnosis not present

## 2019-10-14 DIAGNOSIS — D649 Anemia, unspecified: Secondary | ICD-10-CM | POA: Diagnosis not present

## 2019-10-14 DIAGNOSIS — R042 Hemoptysis: Secondary | ICD-10-CM | POA: Diagnosis not present

## 2019-10-20 DIAGNOSIS — R042 Hemoptysis: Secondary | ICD-10-CM | POA: Diagnosis not present

## 2019-10-20 DIAGNOSIS — A310 Pulmonary mycobacterial infection: Secondary | ICD-10-CM | POA: Diagnosis not present

## 2019-11-06 DIAGNOSIS — J479 Bronchiectasis, uncomplicated: Secondary | ICD-10-CM | POA: Diagnosis not present

## 2019-11-06 DIAGNOSIS — J449 Chronic obstructive pulmonary disease, unspecified: Secondary | ICD-10-CM | POA: Diagnosis not present

## 2019-11-11 DIAGNOSIS — J449 Chronic obstructive pulmonary disease, unspecified: Secondary | ICD-10-CM | POA: Diagnosis not present

## 2019-11-16 DIAGNOSIS — I209 Angina pectoris, unspecified: Secondary | ICD-10-CM | POA: Diagnosis not present

## 2019-11-16 DIAGNOSIS — I259 Chronic ischemic heart disease, unspecified: Secondary | ICD-10-CM | POA: Diagnosis not present

## 2019-11-16 DIAGNOSIS — I739 Peripheral vascular disease, unspecified: Secondary | ICD-10-CM | POA: Diagnosis not present

## 2019-11-24 DIAGNOSIS — R05 Cough: Secondary | ICD-10-CM | POA: Diagnosis not present

## 2019-11-24 DIAGNOSIS — I739 Peripheral vascular disease, unspecified: Secondary | ICD-10-CM | POA: Diagnosis not present

## 2019-11-24 DIAGNOSIS — R42 Dizziness and giddiness: Secondary | ICD-10-CM | POA: Diagnosis not present

## 2019-11-24 DIAGNOSIS — R042 Hemoptysis: Secondary | ICD-10-CM | POA: Diagnosis not present

## 2019-11-24 DIAGNOSIS — R0989 Other specified symptoms and signs involving the circulatory and respiratory systems: Secondary | ICD-10-CM | POA: Diagnosis not present

## 2019-11-24 DIAGNOSIS — R0600 Dyspnea, unspecified: Secondary | ICD-10-CM | POA: Diagnosis not present

## 2019-11-24 DIAGNOSIS — F17210 Nicotine dependence, cigarettes, uncomplicated: Secondary | ICD-10-CM | POA: Diagnosis not present

## 2019-11-24 DIAGNOSIS — I6523 Occlusion and stenosis of bilateral carotid arteries: Secondary | ICD-10-CM | POA: Diagnosis not present

## 2019-11-24 DIAGNOSIS — E1151 Type 2 diabetes mellitus with diabetic peripheral angiopathy without gangrene: Secondary | ICD-10-CM | POA: Diagnosis not present

## 2019-11-24 DIAGNOSIS — J449 Chronic obstructive pulmonary disease, unspecified: Secondary | ICD-10-CM | POA: Diagnosis not present

## 2019-11-24 DIAGNOSIS — R079 Chest pain, unspecified: Secondary | ICD-10-CM | POA: Diagnosis not present

## 2019-11-24 DIAGNOSIS — I70219 Atherosclerosis of native arteries of extremities with intermittent claudication, unspecified extremity: Secondary | ICD-10-CM | POA: Diagnosis not present

## 2019-11-24 DIAGNOSIS — J479 Bronchiectasis, uncomplicated: Secondary | ICD-10-CM | POA: Diagnosis not present

## 2019-11-30 DIAGNOSIS — J449 Chronic obstructive pulmonary disease, unspecified: Secondary | ICD-10-CM | POA: Diagnosis not present

## 2019-12-02 DIAGNOSIS — N400 Enlarged prostate without lower urinary tract symptoms: Secondary | ICD-10-CM | POA: Diagnosis not present

## 2019-12-02 DIAGNOSIS — E538 Deficiency of other specified B group vitamins: Secondary | ICD-10-CM | POA: Diagnosis not present

## 2019-12-02 DIAGNOSIS — I10 Essential (primary) hypertension: Secondary | ICD-10-CM | POA: Diagnosis not present

## 2019-12-02 DIAGNOSIS — A310 Pulmonary mycobacterial infection: Secondary | ICD-10-CM | POA: Diagnosis not present

## 2019-12-02 DIAGNOSIS — Z79899 Other long term (current) drug therapy: Secondary | ICD-10-CM | POA: Diagnosis not present

## 2019-12-02 DIAGNOSIS — D649 Anemia, unspecified: Secondary | ICD-10-CM | POA: Diagnosis not present

## 2019-12-02 DIAGNOSIS — J432 Centrilobular emphysema: Secondary | ICD-10-CM | POA: Diagnosis not present

## 2019-12-02 DIAGNOSIS — F419 Anxiety disorder, unspecified: Secondary | ICD-10-CM | POA: Diagnosis not present

## 2019-12-02 DIAGNOSIS — K2970 Gastritis, unspecified, without bleeding: Secondary | ICD-10-CM | POA: Diagnosis not present

## 2019-12-07 DIAGNOSIS — J479 Bronchiectasis, uncomplicated: Secondary | ICD-10-CM | POA: Diagnosis not present

## 2019-12-07 DIAGNOSIS — J449 Chronic obstructive pulmonary disease, unspecified: Secondary | ICD-10-CM | POA: Diagnosis not present

## 2019-12-12 DIAGNOSIS — J449 Chronic obstructive pulmonary disease, unspecified: Secondary | ICD-10-CM | POA: Diagnosis not present

## 2019-12-13 DIAGNOSIS — Z79891 Long term (current) use of opiate analgesic: Secondary | ICD-10-CM | POA: Diagnosis not present

## 2019-12-13 DIAGNOSIS — M546 Pain in thoracic spine: Secondary | ICD-10-CM | POA: Diagnosis not present

## 2019-12-13 DIAGNOSIS — M545 Low back pain: Secondary | ICD-10-CM | POA: Diagnosis not present

## 2019-12-13 DIAGNOSIS — G8929 Other chronic pain: Secondary | ICD-10-CM | POA: Diagnosis not present

## 2019-12-13 DIAGNOSIS — Z760 Encounter for issue of repeat prescription: Secondary | ICD-10-CM | POA: Diagnosis not present

## 2019-12-13 DIAGNOSIS — Z5181 Encounter for therapeutic drug level monitoring: Secondary | ICD-10-CM | POA: Diagnosis not present

## 2019-12-29 DIAGNOSIS — A310 Pulmonary mycobacterial infection: Secondary | ICD-10-CM | POA: Diagnosis not present

## 2020-01-04 DIAGNOSIS — J479 Bronchiectasis, uncomplicated: Secondary | ICD-10-CM | POA: Diagnosis not present

## 2020-01-04 DIAGNOSIS — J449 Chronic obstructive pulmonary disease, unspecified: Secondary | ICD-10-CM | POA: Diagnosis not present

## 2020-01-09 DIAGNOSIS — N400 Enlarged prostate without lower urinary tract symptoms: Secondary | ICD-10-CM | POA: Diagnosis not present

## 2020-01-09 DIAGNOSIS — G629 Polyneuropathy, unspecified: Secondary | ICD-10-CM | POA: Diagnosis not present

## 2020-01-09 DIAGNOSIS — Z6821 Body mass index (BMI) 21.0-21.9, adult: Secondary | ICD-10-CM | POA: Diagnosis not present

## 2020-01-09 DIAGNOSIS — D649 Anemia, unspecified: Secondary | ICD-10-CM | POA: Diagnosis not present

## 2020-01-09 DIAGNOSIS — F325 Major depressive disorder, single episode, in full remission: Secondary | ICD-10-CM | POA: Diagnosis not present

## 2020-01-09 DIAGNOSIS — E559 Vitamin D deficiency, unspecified: Secondary | ICD-10-CM | POA: Diagnosis not present

## 2020-01-09 DIAGNOSIS — F17210 Nicotine dependence, cigarettes, uncomplicated: Secondary | ICD-10-CM | POA: Diagnosis not present

## 2020-01-09 DIAGNOSIS — M810 Age-related osteoporosis without current pathological fracture: Secondary | ICD-10-CM | POA: Diagnosis not present

## 2020-01-09 DIAGNOSIS — J9610 Chronic respiratory failure, unspecified whether with hypoxia or hypercapnia: Secondary | ICD-10-CM | POA: Diagnosis not present

## 2020-01-09 DIAGNOSIS — A310 Pulmonary mycobacterial infection: Secondary | ICD-10-CM | POA: Diagnosis not present

## 2020-01-09 DIAGNOSIS — M545 Low back pain: Secondary | ICD-10-CM | POA: Diagnosis not present

## 2020-01-09 DIAGNOSIS — F419 Anxiety disorder, unspecified: Secondary | ICD-10-CM | POA: Diagnosis not present

## 2020-01-09 DIAGNOSIS — K219 Gastro-esophageal reflux disease without esophagitis: Secondary | ICD-10-CM | POA: Diagnosis not present

## 2020-01-09 DIAGNOSIS — E785 Hyperlipidemia, unspecified: Secondary | ICD-10-CM | POA: Diagnosis not present

## 2020-01-09 DIAGNOSIS — R630 Anorexia: Secondary | ICD-10-CM | POA: Diagnosis not present

## 2020-01-09 DIAGNOSIS — J449 Chronic obstructive pulmonary disease, unspecified: Secondary | ICD-10-CM | POA: Diagnosis not present

## 2020-01-09 DIAGNOSIS — M62838 Other muscle spasm: Secondary | ICD-10-CM | POA: Diagnosis not present

## 2020-01-09 DIAGNOSIS — H547 Unspecified visual loss: Secondary | ICD-10-CM | POA: Diagnosis not present

## 2020-01-09 DIAGNOSIS — J439 Emphysema, unspecified: Secondary | ICD-10-CM | POA: Diagnosis not present

## 2020-02-04 DIAGNOSIS — J479 Bronchiectasis, uncomplicated: Secondary | ICD-10-CM | POA: Diagnosis not present

## 2020-02-04 DIAGNOSIS — J449 Chronic obstructive pulmonary disease, unspecified: Secondary | ICD-10-CM | POA: Diagnosis not present

## 2020-02-09 DIAGNOSIS — J449 Chronic obstructive pulmonary disease, unspecified: Secondary | ICD-10-CM | POA: Diagnosis not present

## 2020-02-23 DIAGNOSIS — F329 Major depressive disorder, single episode, unspecified: Secondary | ICD-10-CM | POA: Diagnosis not present

## 2020-02-23 DIAGNOSIS — I10 Essential (primary) hypertension: Secondary | ICD-10-CM | POA: Diagnosis not present

## 2020-02-23 DIAGNOSIS — K219 Gastro-esophageal reflux disease without esophagitis: Secondary | ICD-10-CM | POA: Diagnosis not present

## 2020-02-23 DIAGNOSIS — H539 Unspecified visual disturbance: Secondary | ICD-10-CM | POA: Diagnosis not present

## 2020-02-23 DIAGNOSIS — H547 Unspecified visual loss: Secondary | ICD-10-CM | POA: Diagnosis not present

## 2020-02-23 DIAGNOSIS — F419 Anxiety disorder, unspecified: Secondary | ICD-10-CM | POA: Diagnosis not present

## 2020-02-23 DIAGNOSIS — J439 Emphysema, unspecified: Secondary | ICD-10-CM | POA: Diagnosis not present

## 2020-02-23 DIAGNOSIS — G4710 Hypersomnia, unspecified: Secondary | ICD-10-CM | POA: Diagnosis not present

## 2020-02-23 DIAGNOSIS — J45909 Unspecified asthma, uncomplicated: Secondary | ICD-10-CM | POA: Diagnosis not present

## 2020-02-23 DIAGNOSIS — H538 Other visual disturbances: Secondary | ICD-10-CM | POA: Diagnosis not present

## 2020-02-23 DIAGNOSIS — F17210 Nicotine dependence, cigarettes, uncomplicated: Secondary | ICD-10-CM | POA: Diagnosis not present

## 2020-02-23 DIAGNOSIS — J322 Chronic ethmoidal sinusitis: Secondary | ICD-10-CM | POA: Diagnosis not present

## 2020-02-23 DIAGNOSIS — M1990 Unspecified osteoarthritis, unspecified site: Secondary | ICD-10-CM | POA: Diagnosis not present

## 2020-02-24 DIAGNOSIS — F17210 Nicotine dependence, cigarettes, uncomplicated: Secondary | ICD-10-CM | POA: Diagnosis not present

## 2020-02-24 DIAGNOSIS — A310 Pulmonary mycobacterial infection: Secondary | ICD-10-CM | POA: Diagnosis not present

## 2020-02-24 DIAGNOSIS — I10 Essential (primary) hypertension: Secondary | ICD-10-CM | POA: Diagnosis not present

## 2020-02-24 DIAGNOSIS — G8929 Other chronic pain: Secondary | ICD-10-CM | POA: Diagnosis not present

## 2020-02-24 DIAGNOSIS — M546 Pain in thoracic spine: Secondary | ICD-10-CM | POA: Diagnosis not present

## 2020-02-24 DIAGNOSIS — N400 Enlarged prostate without lower urinary tract symptoms: Secondary | ICD-10-CM | POA: Diagnosis not present

## 2020-02-24 DIAGNOSIS — H3413 Central retinal artery occlusion, bilateral: Secondary | ICD-10-CM | POA: Diagnosis not present

## 2020-02-24 DIAGNOSIS — U071 COVID-19: Secondary | ICD-10-CM | POA: Diagnosis not present

## 2020-02-24 DIAGNOSIS — J439 Emphysema, unspecified: Secondary | ICD-10-CM | POA: Diagnosis not present

## 2020-02-24 DIAGNOSIS — H3411 Central retinal artery occlusion, right eye: Secondary | ICD-10-CM | POA: Diagnosis not present

## 2020-02-24 DIAGNOSIS — K219 Gastro-esophageal reflux disease without esophagitis: Secondary | ICD-10-CM | POA: Diagnosis not present

## 2020-02-24 DIAGNOSIS — I739 Peripheral vascular disease, unspecified: Secondary | ICD-10-CM | POA: Diagnosis not present

## 2020-02-25 DIAGNOSIS — H3411 Central retinal artery occlusion, right eye: Secondary | ICD-10-CM | POA: Diagnosis not present

## 2020-02-25 DIAGNOSIS — I358 Other nonrheumatic aortic valve disorders: Secondary | ICD-10-CM | POA: Diagnosis not present

## 2020-02-25 DIAGNOSIS — I371 Nonrheumatic pulmonary valve insufficiency: Secondary | ICD-10-CM | POA: Diagnosis not present

## 2020-02-25 DIAGNOSIS — I6523 Occlusion and stenosis of bilateral carotid arteries: Secondary | ICD-10-CM | POA: Diagnosis not present

## 2020-02-25 DIAGNOSIS — H3410 Central retinal artery occlusion, unspecified eye: Secondary | ICD-10-CM | POA: Diagnosis not present

## 2020-02-25 DIAGNOSIS — I639 Cerebral infarction, unspecified: Secondary | ICD-10-CM | POA: Diagnosis not present

## 2020-02-25 DIAGNOSIS — I350 Nonrheumatic aortic (valve) stenosis: Secondary | ICD-10-CM | POA: Diagnosis not present

## 2020-02-28 DIAGNOSIS — K5900 Constipation, unspecified: Secondary | ICD-10-CM | POA: Diagnosis not present

## 2020-02-28 DIAGNOSIS — Z6822 Body mass index (BMI) 22.0-22.9, adult: Secondary | ICD-10-CM | POA: Diagnosis not present

## 2020-02-28 DIAGNOSIS — Z79899 Other long term (current) drug therapy: Secondary | ICD-10-CM | POA: Diagnosis not present

## 2020-03-05 DIAGNOSIS — J479 Bronchiectasis, uncomplicated: Secondary | ICD-10-CM | POA: Diagnosis not present

## 2020-03-05 DIAGNOSIS — J449 Chronic obstructive pulmonary disease, unspecified: Secondary | ICD-10-CM | POA: Diagnosis not present

## 2020-03-07 DIAGNOSIS — M545 Low back pain: Secondary | ICD-10-CM | POA: Diagnosis not present

## 2020-03-07 DIAGNOSIS — M546 Pain in thoracic spine: Secondary | ICD-10-CM | POA: Diagnosis not present

## 2020-03-07 DIAGNOSIS — G8929 Other chronic pain: Secondary | ICD-10-CM | POA: Diagnosis not present

## 2020-03-07 DIAGNOSIS — Z760 Encounter for issue of repeat prescription: Secondary | ICD-10-CM | POA: Diagnosis not present

## 2020-03-07 DIAGNOSIS — Z79899 Other long term (current) drug therapy: Secondary | ICD-10-CM | POA: Diagnosis not present

## 2020-03-07 DIAGNOSIS — H524 Presbyopia: Secondary | ICD-10-CM | POA: Diagnosis not present

## 2020-03-07 DIAGNOSIS — H3411 Central retinal artery occlusion, right eye: Secondary | ICD-10-CM | POA: Diagnosis not present

## 2020-03-07 DIAGNOSIS — Z79891 Long term (current) use of opiate analgesic: Secondary | ICD-10-CM | POA: Diagnosis not present

## 2020-03-09 DIAGNOSIS — E559 Vitamin D deficiency, unspecified: Secondary | ICD-10-CM | POA: Diagnosis not present

## 2020-03-09 DIAGNOSIS — G8929 Other chronic pain: Secondary | ICD-10-CM | POA: Diagnosis not present

## 2020-03-09 DIAGNOSIS — D649 Anemia, unspecified: Secondary | ICD-10-CM | POA: Diagnosis not present

## 2020-03-09 DIAGNOSIS — E538 Deficiency of other specified B group vitamins: Secondary | ICD-10-CM | POA: Diagnosis not present

## 2020-03-09 DIAGNOSIS — E785 Hyperlipidemia, unspecified: Secondary | ICD-10-CM | POA: Diagnosis not present

## 2020-03-09 DIAGNOSIS — H3411 Central retinal artery occlusion, right eye: Secondary | ICD-10-CM | POA: Diagnosis not present

## 2020-03-09 DIAGNOSIS — J432 Centrilobular emphysema: Secondary | ICD-10-CM | POA: Diagnosis not present

## 2020-03-09 DIAGNOSIS — I10 Essential (primary) hypertension: Secondary | ICD-10-CM | POA: Diagnosis not present

## 2020-03-09 DIAGNOSIS — A310 Pulmonary mycobacterial infection: Secondary | ICD-10-CM | POA: Diagnosis not present

## 2020-03-10 DIAGNOSIS — J449 Chronic obstructive pulmonary disease, unspecified: Secondary | ICD-10-CM | POA: Diagnosis not present

## 2020-03-22 DIAGNOSIS — J449 Chronic obstructive pulmonary disease, unspecified: Secondary | ICD-10-CM | POA: Diagnosis not present

## 2020-03-22 DIAGNOSIS — A310 Pulmonary mycobacterial infection: Secondary | ICD-10-CM | POA: Diagnosis not present

## 2020-03-22 DIAGNOSIS — F17200 Nicotine dependence, unspecified, uncomplicated: Secondary | ICD-10-CM | POA: Diagnosis not present

## 2020-03-22 DIAGNOSIS — J9610 Chronic respiratory failure, unspecified whether with hypoxia or hypercapnia: Secondary | ICD-10-CM | POA: Diagnosis not present

## 2020-03-22 DIAGNOSIS — F17210 Nicotine dependence, cigarettes, uncomplicated: Secondary | ICD-10-CM | POA: Diagnosis not present

## 2020-03-22 DIAGNOSIS — J479 Bronchiectasis, uncomplicated: Secondary | ICD-10-CM | POA: Diagnosis not present

## 2020-03-30 DIAGNOSIS — H3411 Central retinal artery occlusion, right eye: Secondary | ICD-10-CM | POA: Diagnosis not present

## 2020-04-05 DIAGNOSIS — J479 Bronchiectasis, uncomplicated: Secondary | ICD-10-CM | POA: Diagnosis not present

## 2020-04-05 DIAGNOSIS — J449 Chronic obstructive pulmonary disease, unspecified: Secondary | ICD-10-CM | POA: Diagnosis not present

## 2020-04-10 DIAGNOSIS — J449 Chronic obstructive pulmonary disease, unspecified: Secondary | ICD-10-CM | POA: Diagnosis not present

## 2020-04-21 DIAGNOSIS — K219 Gastro-esophageal reflux disease without esophagitis: Secondary | ICD-10-CM | POA: Diagnosis not present

## 2020-04-21 DIAGNOSIS — Z87891 Personal history of nicotine dependence: Secondary | ICD-10-CM | POA: Diagnosis not present

## 2020-04-21 DIAGNOSIS — G4730 Sleep apnea, unspecified: Secondary | ICD-10-CM | POA: Diagnosis not present

## 2020-04-21 DIAGNOSIS — S2241XA Multiple fractures of ribs, right side, initial encounter for closed fracture: Secondary | ICD-10-CM | POA: Diagnosis not present

## 2020-04-21 DIAGNOSIS — F419 Anxiety disorder, unspecified: Secondary | ICD-10-CM | POA: Diagnosis not present

## 2020-04-21 DIAGNOSIS — M1990 Unspecified osteoarthritis, unspecified site: Secondary | ICD-10-CM | POA: Diagnosis not present

## 2020-04-21 DIAGNOSIS — I6521 Occlusion and stenosis of right carotid artery: Secondary | ICD-10-CM | POA: Diagnosis not present

## 2020-04-21 DIAGNOSIS — I10 Essential (primary) hypertension: Secondary | ICD-10-CM | POA: Diagnosis not present

## 2020-04-21 DIAGNOSIS — H93A9 Pulsatile tinnitus, unspecified ear: Secondary | ICD-10-CM | POA: Diagnosis not present

## 2020-04-21 DIAGNOSIS — R42 Dizziness and giddiness: Secondary | ICD-10-CM | POA: Diagnosis not present

## 2020-04-21 DIAGNOSIS — F329 Major depressive disorder, single episode, unspecified: Secondary | ICD-10-CM | POA: Diagnosis not present

## 2020-04-21 DIAGNOSIS — J439 Emphysema, unspecified: Secondary | ICD-10-CM | POA: Diagnosis not present

## 2020-04-21 DIAGNOSIS — F17210 Nicotine dependence, cigarettes, uncomplicated: Secondary | ICD-10-CM | POA: Diagnosis not present

## 2020-04-21 DIAGNOSIS — J449 Chronic obstructive pulmonary disease, unspecified: Secondary | ICD-10-CM | POA: Diagnosis not present

## 2020-04-26 DIAGNOSIS — A310 Pulmonary mycobacterial infection: Secondary | ICD-10-CM | POA: Diagnosis not present

## 2020-04-27 DIAGNOSIS — K2970 Gastritis, unspecified, without bleeding: Secondary | ICD-10-CM | POA: Diagnosis not present

## 2020-04-27 DIAGNOSIS — F419 Anxiety disorder, unspecified: Secondary | ICD-10-CM | POA: Diagnosis not present

## 2020-04-27 DIAGNOSIS — Z6821 Body mass index (BMI) 21.0-21.9, adult: Secondary | ICD-10-CM | POA: Diagnosis not present

## 2020-04-27 DIAGNOSIS — G4700 Insomnia, unspecified: Secondary | ICD-10-CM | POA: Diagnosis not present

## 2020-04-27 DIAGNOSIS — E559 Vitamin D deficiency, unspecified: Secondary | ICD-10-CM | POA: Diagnosis not present

## 2020-04-27 DIAGNOSIS — N400 Enlarged prostate without lower urinary tract symptoms: Secondary | ICD-10-CM | POA: Diagnosis not present

## 2020-04-27 DIAGNOSIS — E538 Deficiency of other specified B group vitamins: Secondary | ICD-10-CM | POA: Diagnosis not present

## 2020-05-05 DIAGNOSIS — J479 Bronchiectasis, uncomplicated: Secondary | ICD-10-CM | POA: Diagnosis not present

## 2020-05-05 DIAGNOSIS — J449 Chronic obstructive pulmonary disease, unspecified: Secondary | ICD-10-CM | POA: Diagnosis not present

## 2020-05-10 DIAGNOSIS — J449 Chronic obstructive pulmonary disease, unspecified: Secondary | ICD-10-CM | POA: Diagnosis not present

## 2020-05-28 DIAGNOSIS — Z6821 Body mass index (BMI) 21.0-21.9, adult: Secondary | ICD-10-CM | POA: Diagnosis not present

## 2020-05-28 DIAGNOSIS — F419 Anxiety disorder, unspecified: Secondary | ICD-10-CM | POA: Diagnosis not present

## 2020-05-28 DIAGNOSIS — N400 Enlarged prostate without lower urinary tract symptoms: Secondary | ICD-10-CM | POA: Diagnosis not present

## 2020-05-28 DIAGNOSIS — A310 Pulmonary mycobacterial infection: Secondary | ICD-10-CM | POA: Diagnosis not present

## 2020-05-28 DIAGNOSIS — E559 Vitamin D deficiency, unspecified: Secondary | ICD-10-CM | POA: Diagnosis not present

## 2020-05-28 DIAGNOSIS — E538 Deficiency of other specified B group vitamins: Secondary | ICD-10-CM | POA: Diagnosis not present

## 2020-05-28 DIAGNOSIS — E785 Hyperlipidemia, unspecified: Secondary | ICD-10-CM | POA: Diagnosis not present

## 2020-05-28 DIAGNOSIS — K2970 Gastritis, unspecified, without bleeding: Secondary | ICD-10-CM | POA: Diagnosis not present

## 2020-05-28 DIAGNOSIS — D649 Anemia, unspecified: Secondary | ICD-10-CM | POA: Diagnosis not present

## 2020-05-28 DIAGNOSIS — J432 Centrilobular emphysema: Secondary | ICD-10-CM | POA: Diagnosis not present

## 2020-06-05 DIAGNOSIS — J449 Chronic obstructive pulmonary disease, unspecified: Secondary | ICD-10-CM | POA: Diagnosis not present

## 2020-06-05 DIAGNOSIS — J479 Bronchiectasis, uncomplicated: Secondary | ICD-10-CM | POA: Diagnosis not present

## 2020-06-10 DIAGNOSIS — J449 Chronic obstructive pulmonary disease, unspecified: Secondary | ICD-10-CM | POA: Diagnosis not present

## 2020-06-13 DIAGNOSIS — H542X12 Low vision right eye category 1, low vision left eye category 2: Secondary | ICD-10-CM | POA: Diagnosis not present

## 2020-07-04 DIAGNOSIS — Z79891 Long term (current) use of opiate analgesic: Secondary | ICD-10-CM | POA: Diagnosis not present

## 2020-07-04 DIAGNOSIS — M546 Pain in thoracic spine: Secondary | ICD-10-CM | POA: Diagnosis not present

## 2020-07-04 DIAGNOSIS — M545 Low back pain: Secondary | ICD-10-CM | POA: Diagnosis not present

## 2020-07-04 DIAGNOSIS — G8929 Other chronic pain: Secondary | ICD-10-CM | POA: Diagnosis not present

## 2020-07-11 DIAGNOSIS — J449 Chronic obstructive pulmonary disease, unspecified: Secondary | ICD-10-CM | POA: Diagnosis not present

## 2020-07-26 DIAGNOSIS — A310 Pulmonary mycobacterial infection: Secondary | ICD-10-CM | POA: Diagnosis not present

## 2020-08-10 DIAGNOSIS — J449 Chronic obstructive pulmonary disease, unspecified: Secondary | ICD-10-CM | POA: Diagnosis not present

## 2020-09-10 DIAGNOSIS — J449 Chronic obstructive pulmonary disease, unspecified: Secondary | ICD-10-CM | POA: Diagnosis not present

## 2020-09-20 DIAGNOSIS — H3411 Central retinal artery occlusion, right eye: Secondary | ICD-10-CM | POA: Diagnosis not present

## 2020-10-03 DIAGNOSIS — Z79891 Long term (current) use of opiate analgesic: Secondary | ICD-10-CM | POA: Diagnosis not present

## 2020-10-03 DIAGNOSIS — G8929 Other chronic pain: Secondary | ICD-10-CM | POA: Diagnosis not present

## 2020-10-03 DIAGNOSIS — M546 Pain in thoracic spine: Secondary | ICD-10-CM | POA: Diagnosis not present

## 2020-10-03 DIAGNOSIS — M5459 Other low back pain: Secondary | ICD-10-CM | POA: Diagnosis not present

## 2020-10-03 DIAGNOSIS — M545 Low back pain, unspecified: Secondary | ICD-10-CM | POA: Diagnosis not present

## 2020-10-03 DIAGNOSIS — Z5181 Encounter for therapeutic drug level monitoring: Secondary | ICD-10-CM | POA: Diagnosis not present

## 2020-10-10 DIAGNOSIS — J449 Chronic obstructive pulmonary disease, unspecified: Secondary | ICD-10-CM | POA: Diagnosis not present

## 2020-10-15 DIAGNOSIS — F17200 Nicotine dependence, unspecified, uncomplicated: Secondary | ICD-10-CM | POA: Diagnosis not present

## 2020-10-15 DIAGNOSIS — J9610 Chronic respiratory failure, unspecified whether with hypoxia or hypercapnia: Secondary | ICD-10-CM | POA: Diagnosis not present

## 2020-10-15 DIAGNOSIS — J479 Bronchiectasis, uncomplicated: Secondary | ICD-10-CM | POA: Diagnosis not present

## 2020-10-15 DIAGNOSIS — A310 Pulmonary mycobacterial infection: Secondary | ICD-10-CM | POA: Diagnosis not present

## 2020-10-15 DIAGNOSIS — J449 Chronic obstructive pulmonary disease, unspecified: Secondary | ICD-10-CM | POA: Diagnosis not present

## 2020-10-28 DIAGNOSIS — R042 Hemoptysis: Secondary | ICD-10-CM | POA: Diagnosis not present

## 2020-10-28 DIAGNOSIS — J9601 Acute respiratory failure with hypoxia: Secondary | ICD-10-CM | POA: Diagnosis not present

## 2020-10-28 DIAGNOSIS — J439 Emphysema, unspecified: Secondary | ICD-10-CM | POA: Diagnosis not present

## 2020-10-28 DIAGNOSIS — R0689 Other abnormalities of breathing: Secondary | ICD-10-CM | POA: Diagnosis not present

## 2020-10-28 DIAGNOSIS — I10 Essential (primary) hypertension: Secondary | ICD-10-CM | POA: Diagnosis not present

## 2020-10-28 DIAGNOSIS — F418 Other specified anxiety disorders: Secondary | ICD-10-CM | POA: Diagnosis not present

## 2020-10-28 DIAGNOSIS — J9622 Acute and chronic respiratory failure with hypercapnia: Secondary | ICD-10-CM | POA: Diagnosis not present

## 2020-10-28 DIAGNOSIS — J432 Centrilobular emphysema: Secondary | ICD-10-CM | POA: Diagnosis not present

## 2020-10-28 DIAGNOSIS — J449 Chronic obstructive pulmonary disease, unspecified: Secondary | ICD-10-CM | POA: Diagnosis not present

## 2020-10-28 DIAGNOSIS — J9602 Acute respiratory failure with hypercapnia: Secondary | ICD-10-CM | POA: Diagnosis not present

## 2020-10-28 DIAGNOSIS — Z20822 Contact with and (suspected) exposure to covid-19: Secondary | ICD-10-CM | POA: Diagnosis not present

## 2020-10-28 DIAGNOSIS — J441 Chronic obstructive pulmonary disease with (acute) exacerbation: Secondary | ICD-10-CM | POA: Diagnosis not present

## 2020-10-28 DIAGNOSIS — R0600 Dyspnea, unspecified: Secondary | ICD-10-CM | POA: Diagnosis not present

## 2020-10-28 DIAGNOSIS — F17210 Nicotine dependence, cigarettes, uncomplicated: Secondary | ICD-10-CM | POA: Diagnosis not present

## 2020-10-29 DIAGNOSIS — J9602 Acute respiratory failure with hypercapnia: Secondary | ICD-10-CM | POA: Diagnosis not present

## 2020-10-29 DIAGNOSIS — J9601 Acute respiratory failure with hypoxia: Secondary | ICD-10-CM | POA: Diagnosis not present

## 2020-10-29 DIAGNOSIS — Z20822 Contact with and (suspected) exposure to covid-19: Secondary | ICD-10-CM | POA: Diagnosis not present

## 2020-10-29 DIAGNOSIS — J441 Chronic obstructive pulmonary disease with (acute) exacerbation: Secondary | ICD-10-CM | POA: Diagnosis not present

## 2020-10-30 DIAGNOSIS — J8410 Pulmonary fibrosis, unspecified: Secondary | ICD-10-CM | POA: Diagnosis not present

## 2020-10-30 DIAGNOSIS — J984 Other disorders of lung: Secondary | ICD-10-CM | POA: Diagnosis not present

## 2020-10-30 DIAGNOSIS — J9602 Acute respiratory failure with hypercapnia: Secondary | ICD-10-CM | POA: Diagnosis not present

## 2020-10-30 DIAGNOSIS — J9601 Acute respiratory failure with hypoxia: Secondary | ICD-10-CM | POA: Diagnosis not present

## 2020-10-30 DIAGNOSIS — J90 Pleural effusion, not elsewhere classified: Secondary | ICD-10-CM | POA: Diagnosis not present

## 2020-10-30 DIAGNOSIS — J441 Chronic obstructive pulmonary disease with (acute) exacerbation: Secondary | ICD-10-CM | POA: Diagnosis not present

## 2020-10-30 DIAGNOSIS — Z20822 Contact with and (suspected) exposure to covid-19: Secondary | ICD-10-CM | POA: Diagnosis not present

## 2020-10-31 DIAGNOSIS — J90 Pleural effusion, not elsewhere classified: Secondary | ICD-10-CM | POA: Diagnosis not present

## 2020-10-31 DIAGNOSIS — J984 Other disorders of lung: Secondary | ICD-10-CM | POA: Diagnosis not present

## 2020-10-31 DIAGNOSIS — J9601 Acute respiratory failure with hypoxia: Secondary | ICD-10-CM | POA: Diagnosis not present

## 2020-10-31 DIAGNOSIS — J8410 Pulmonary fibrosis, unspecified: Secondary | ICD-10-CM | POA: Diagnosis not present

## 2020-10-31 DIAGNOSIS — J9602 Acute respiratory failure with hypercapnia: Secondary | ICD-10-CM | POA: Diagnosis not present

## 2020-10-31 DIAGNOSIS — Z20822 Contact with and (suspected) exposure to covid-19: Secondary | ICD-10-CM | POA: Diagnosis not present

## 2020-10-31 DIAGNOSIS — J441 Chronic obstructive pulmonary disease with (acute) exacerbation: Secondary | ICD-10-CM | POA: Diagnosis not present

## 2020-11-10 DIAGNOSIS — J449 Chronic obstructive pulmonary disease, unspecified: Secondary | ICD-10-CM | POA: Diagnosis not present

## 2020-11-12 DIAGNOSIS — E538 Deficiency of other specified B group vitamins: Secondary | ICD-10-CM | POA: Diagnosis not present

## 2020-11-12 DIAGNOSIS — Z79899 Other long term (current) drug therapy: Secondary | ICD-10-CM | POA: Diagnosis not present

## 2020-11-12 DIAGNOSIS — G8929 Other chronic pain: Secondary | ICD-10-CM | POA: Diagnosis not present

## 2020-11-12 DIAGNOSIS — Z6820 Body mass index (BMI) 20.0-20.9, adult: Secondary | ICD-10-CM | POA: Diagnosis not present

## 2020-11-12 DIAGNOSIS — A310 Pulmonary mycobacterial infection: Secondary | ICD-10-CM | POA: Diagnosis not present

## 2020-11-12 DIAGNOSIS — F419 Anxiety disorder, unspecified: Secondary | ICD-10-CM | POA: Diagnosis not present

## 2020-11-12 DIAGNOSIS — K219 Gastro-esophageal reflux disease without esophagitis: Secondary | ICD-10-CM | POA: Diagnosis not present

## 2020-11-12 DIAGNOSIS — I10 Essential (primary) hypertension: Secondary | ICD-10-CM | POA: Diagnosis not present

## 2020-11-12 DIAGNOSIS — D649 Anemia, unspecified: Secondary | ICD-10-CM | POA: Diagnosis not present

## 2020-12-11 DIAGNOSIS — J449 Chronic obstructive pulmonary disease, unspecified: Secondary | ICD-10-CM | POA: Diagnosis not present

## 2020-12-13 DIAGNOSIS — F419 Anxiety disorder, unspecified: Secondary | ICD-10-CM | POA: Diagnosis not present

## 2020-12-13 DIAGNOSIS — I10 Essential (primary) hypertension: Secondary | ICD-10-CM | POA: Diagnosis not present

## 2020-12-13 DIAGNOSIS — A310 Pulmonary mycobacterial infection: Secondary | ICD-10-CM | POA: Diagnosis not present

## 2020-12-13 DIAGNOSIS — Z6820 Body mass index (BMI) 20.0-20.9, adult: Secondary | ICD-10-CM | POA: Diagnosis not present

## 2020-12-13 DIAGNOSIS — Z125 Encounter for screening for malignant neoplasm of prostate: Secondary | ICD-10-CM | POA: Diagnosis not present

## 2020-12-13 DIAGNOSIS — Z79899 Other long term (current) drug therapy: Secondary | ICD-10-CM | POA: Diagnosis not present

## 2020-12-13 DIAGNOSIS — E559 Vitamin D deficiency, unspecified: Secondary | ICD-10-CM | POA: Diagnosis not present

## 2020-12-13 DIAGNOSIS — D649 Anemia, unspecified: Secondary | ICD-10-CM | POA: Diagnosis not present

## 2020-12-13 DIAGNOSIS — J432 Centrilobular emphysema: Secondary | ICD-10-CM | POA: Diagnosis not present

## 2020-12-26 DIAGNOSIS — M546 Pain in thoracic spine: Secondary | ICD-10-CM | POA: Diagnosis not present

## 2020-12-26 DIAGNOSIS — Z79891 Long term (current) use of opiate analgesic: Secondary | ICD-10-CM | POA: Diagnosis not present

## 2020-12-26 DIAGNOSIS — A310 Pulmonary mycobacterial infection: Secondary | ICD-10-CM | POA: Diagnosis not present

## 2020-12-26 DIAGNOSIS — G8929 Other chronic pain: Secondary | ICD-10-CM | POA: Diagnosis not present

## 2020-12-26 DIAGNOSIS — R058 Other specified cough: Secondary | ICD-10-CM | POA: Diagnosis not present

## 2020-12-26 DIAGNOSIS — M545 Low back pain, unspecified: Secondary | ICD-10-CM | POA: Diagnosis not present

## 2021-01-08 DIAGNOSIS — J449 Chronic obstructive pulmonary disease, unspecified: Secondary | ICD-10-CM | POA: Diagnosis not present

## 2021-01-10 DIAGNOSIS — R739 Hyperglycemia, unspecified: Secondary | ICD-10-CM | POA: Diagnosis not present

## 2021-01-10 DIAGNOSIS — K219 Gastro-esophageal reflux disease without esophagitis: Secondary | ICD-10-CM | POA: Diagnosis not present

## 2021-01-10 DIAGNOSIS — J432 Centrilobular emphysema: Secondary | ICD-10-CM | POA: Diagnosis not present

## 2021-01-10 DIAGNOSIS — A310 Pulmonary mycobacterial infection: Secondary | ICD-10-CM | POA: Diagnosis not present

## 2021-01-10 DIAGNOSIS — D649 Anemia, unspecified: Secondary | ICD-10-CM | POA: Diagnosis not present

## 2021-01-10 DIAGNOSIS — F419 Anxiety disorder, unspecified: Secondary | ICD-10-CM | POA: Diagnosis not present

## 2021-01-10 DIAGNOSIS — J411 Mucopurulent chronic bronchitis: Secondary | ICD-10-CM | POA: Diagnosis not present

## 2021-01-10 DIAGNOSIS — G8929 Other chronic pain: Secondary | ICD-10-CM | POA: Diagnosis not present

## 2021-01-10 DIAGNOSIS — I10 Essential (primary) hypertension: Secondary | ICD-10-CM | POA: Diagnosis not present

## 2021-01-10 DIAGNOSIS — Z6820 Body mass index (BMI) 20.0-20.9, adult: Secondary | ICD-10-CM | POA: Diagnosis not present

## 2021-01-17 DIAGNOSIS — H3411 Central retinal artery occlusion, right eye: Secondary | ICD-10-CM | POA: Diagnosis not present

## 2021-02-07 DIAGNOSIS — A498 Other bacterial infections of unspecified site: Secondary | ICD-10-CM | POA: Diagnosis not present

## 2021-02-07 DIAGNOSIS — A310 Pulmonary mycobacterial infection: Secondary | ICD-10-CM | POA: Diagnosis not present

## 2021-02-08 DIAGNOSIS — J449 Chronic obstructive pulmonary disease, unspecified: Secondary | ICD-10-CM | POA: Diagnosis not present

## 2021-03-04 DIAGNOSIS — T371X5A Adverse effect of antimycobacterial drugs, initial encounter: Secondary | ICD-10-CM | POA: Diagnosis not present

## 2021-03-04 DIAGNOSIS — H4720 Unspecified optic atrophy: Secondary | ICD-10-CM | POA: Diagnosis not present

## 2021-03-10 DIAGNOSIS — J449 Chronic obstructive pulmonary disease, unspecified: Secondary | ICD-10-CM | POA: Diagnosis not present

## 2021-03-12 DIAGNOSIS — H539 Unspecified visual disturbance: Secondary | ICD-10-CM | POA: Diagnosis not present

## 2021-03-12 DIAGNOSIS — R9082 White matter disease, unspecified: Secondary | ICD-10-CM | POA: Diagnosis not present

## 2021-03-12 DIAGNOSIS — G319 Degenerative disease of nervous system, unspecified: Secondary | ICD-10-CM | POA: Diagnosis not present

## 2021-03-20 DIAGNOSIS — T371X5A Adverse effect of antimycobacterial drugs, initial encounter: Secondary | ICD-10-CM | POA: Diagnosis not present

## 2021-03-20 DIAGNOSIS — H4720 Unspecified optic atrophy: Secondary | ICD-10-CM | POA: Diagnosis not present

## 2021-03-25 DIAGNOSIS — G8929 Other chronic pain: Secondary | ICD-10-CM | POA: Diagnosis not present

## 2021-03-25 DIAGNOSIS — A310 Pulmonary mycobacterial infection: Secondary | ICD-10-CM | POA: Diagnosis not present

## 2021-03-25 DIAGNOSIS — M545 Low back pain, unspecified: Secondary | ICD-10-CM | POA: Diagnosis not present

## 2021-03-25 DIAGNOSIS — M546 Pain in thoracic spine: Secondary | ICD-10-CM | POA: Diagnosis not present

## 2021-03-25 DIAGNOSIS — M5416 Radiculopathy, lumbar region: Secondary | ICD-10-CM | POA: Diagnosis not present

## 2021-03-28 DIAGNOSIS — A310 Pulmonary mycobacterial infection: Secondary | ICD-10-CM | POA: Diagnosis not present

## 2021-04-08 DIAGNOSIS — J449 Chronic obstructive pulmonary disease, unspecified: Secondary | ICD-10-CM | POA: Diagnosis not present

## 2021-04-23 DIAGNOSIS — R0600 Dyspnea, unspecified: Secondary | ICD-10-CM | POA: Diagnosis not present

## 2021-04-23 DIAGNOSIS — A310 Pulmonary mycobacterial infection: Secondary | ICD-10-CM | POA: Diagnosis not present

## 2021-04-25 DIAGNOSIS — A310 Pulmonary mycobacterial infection: Secondary | ICD-10-CM | POA: Diagnosis not present

## 2021-04-26 DIAGNOSIS — J479 Bronchiectasis, uncomplicated: Secondary | ICD-10-CM | POA: Diagnosis not present

## 2021-04-26 DIAGNOSIS — F17210 Nicotine dependence, cigarettes, uncomplicated: Secondary | ICD-10-CM | POA: Diagnosis not present

## 2021-04-26 DIAGNOSIS — J9610 Chronic respiratory failure, unspecified whether with hypoxia or hypercapnia: Secondary | ICD-10-CM | POA: Diagnosis not present

## 2021-04-26 DIAGNOSIS — A310 Pulmonary mycobacterial infection: Secondary | ICD-10-CM | POA: Diagnosis not present

## 2021-04-26 DIAGNOSIS — F17200 Nicotine dependence, unspecified, uncomplicated: Secondary | ICD-10-CM | POA: Diagnosis not present

## 2021-04-26 DIAGNOSIS — J209 Acute bronchitis, unspecified: Secondary | ICD-10-CM | POA: Diagnosis not present

## 2021-05-08 DIAGNOSIS — J449 Chronic obstructive pulmonary disease, unspecified: Secondary | ICD-10-CM | POA: Diagnosis not present

## 2021-05-21 DIAGNOSIS — J9610 Chronic respiratory failure, unspecified whether with hypoxia or hypercapnia: Secondary | ICD-10-CM | POA: Diagnosis not present

## 2021-05-21 DIAGNOSIS — I209 Angina pectoris, unspecified: Secondary | ICD-10-CM | POA: Diagnosis not present

## 2021-05-21 DIAGNOSIS — E785 Hyperlipidemia, unspecified: Secondary | ICD-10-CM | POA: Diagnosis not present

## 2021-05-21 DIAGNOSIS — R079 Chest pain, unspecified: Secondary | ICD-10-CM | POA: Diagnosis not present

## 2021-05-21 DIAGNOSIS — I259 Chronic ischemic heart disease, unspecified: Secondary | ICD-10-CM | POA: Diagnosis not present

## 2021-05-24 DIAGNOSIS — F17200 Nicotine dependence, unspecified, uncomplicated: Secondary | ICD-10-CM | POA: Diagnosis not present

## 2021-05-24 DIAGNOSIS — J479 Bronchiectasis, uncomplicated: Secondary | ICD-10-CM | POA: Diagnosis not present

## 2021-05-24 DIAGNOSIS — J449 Chronic obstructive pulmonary disease, unspecified: Secondary | ICD-10-CM | POA: Diagnosis not present

## 2021-05-24 DIAGNOSIS — J9610 Chronic respiratory failure, unspecified whether with hypoxia or hypercapnia: Secondary | ICD-10-CM | POA: Diagnosis not present

## 2021-05-24 DIAGNOSIS — F17210 Nicotine dependence, cigarettes, uncomplicated: Secondary | ICD-10-CM | POA: Diagnosis not present

## 2021-05-24 DIAGNOSIS — A310 Pulmonary mycobacterial infection: Secondary | ICD-10-CM | POA: Diagnosis not present

## 2021-05-24 DIAGNOSIS — E119 Type 2 diabetes mellitus without complications: Secondary | ICD-10-CM | POA: Diagnosis not present

## 2021-05-30 DIAGNOSIS — J449 Chronic obstructive pulmonary disease, unspecified: Secondary | ICD-10-CM | POA: Diagnosis not present

## 2021-05-30 DIAGNOSIS — F410 Panic disorder [episodic paroxysmal anxiety] without agoraphobia: Secondary | ICD-10-CM | POA: Diagnosis not present

## 2021-05-31 DIAGNOSIS — J449 Chronic obstructive pulmonary disease, unspecified: Secondary | ICD-10-CM | POA: Diagnosis not present

## 2021-05-31 DIAGNOSIS — J9612 Chronic respiratory failure with hypercapnia: Secondary | ICD-10-CM | POA: Diagnosis not present

## 2021-05-31 DIAGNOSIS — J479 Bronchiectasis, uncomplicated: Secondary | ICD-10-CM | POA: Diagnosis not present

## 2021-06-08 DIAGNOSIS — J449 Chronic obstructive pulmonary disease, unspecified: Secondary | ICD-10-CM | POA: Diagnosis not present

## 2021-06-18 DIAGNOSIS — F419 Anxiety disorder, unspecified: Secondary | ICD-10-CM | POA: Diagnosis not present

## 2021-06-25 DIAGNOSIS — Z79891 Long term (current) use of opiate analgesic: Secondary | ICD-10-CM | POA: Diagnosis not present

## 2021-06-25 DIAGNOSIS — M546 Pain in thoracic spine: Secondary | ICD-10-CM | POA: Diagnosis not present

## 2021-06-25 DIAGNOSIS — M4644 Discitis, unspecified, thoracic region: Secondary | ICD-10-CM | POA: Diagnosis not present

## 2021-06-25 DIAGNOSIS — M545 Low back pain, unspecified: Secondary | ICD-10-CM | POA: Diagnosis not present

## 2021-06-25 DIAGNOSIS — A310 Pulmonary mycobacterial infection: Secondary | ICD-10-CM | POA: Diagnosis not present

## 2021-06-25 DIAGNOSIS — G8929 Other chronic pain: Secondary | ICD-10-CM | POA: Diagnosis not present

## 2021-07-01 DIAGNOSIS — J9612 Chronic respiratory failure with hypercapnia: Secondary | ICD-10-CM | POA: Diagnosis not present

## 2021-07-01 DIAGNOSIS — J449 Chronic obstructive pulmonary disease, unspecified: Secondary | ICD-10-CM | POA: Diagnosis not present

## 2021-07-01 DIAGNOSIS — J479 Bronchiectasis, uncomplicated: Secondary | ICD-10-CM | POA: Diagnosis not present

## 2021-07-09 DIAGNOSIS — J449 Chronic obstructive pulmonary disease, unspecified: Secondary | ICD-10-CM | POA: Diagnosis not present

## 2021-07-16 DIAGNOSIS — F410 Panic disorder [episodic paroxysmal anxiety] without agoraphobia: Secondary | ICD-10-CM | POA: Diagnosis not present

## 2021-07-26 DIAGNOSIS — I6529 Occlusion and stenosis of unspecified carotid artery: Secondary | ICD-10-CM | POA: Diagnosis not present

## 2021-07-26 DIAGNOSIS — Z9889 Other specified postprocedural states: Secondary | ICD-10-CM | POA: Diagnosis not present

## 2021-07-26 DIAGNOSIS — J0100 Acute maxillary sinusitis, unspecified: Secondary | ICD-10-CM | POA: Diagnosis not present

## 2021-07-26 DIAGNOSIS — I10 Essential (primary) hypertension: Secondary | ICD-10-CM | POA: Diagnosis not present

## 2021-07-26 DIAGNOSIS — E782 Mixed hyperlipidemia: Secondary | ICD-10-CM | POA: Diagnosis not present

## 2021-07-26 DIAGNOSIS — F418 Other specified anxiety disorders: Secondary | ICD-10-CM | POA: Diagnosis not present

## 2021-07-26 DIAGNOSIS — K219 Gastro-esophageal reflux disease without esophagitis: Secondary | ICD-10-CM | POA: Diagnosis not present

## 2021-07-26 DIAGNOSIS — A310 Pulmonary mycobacterial infection: Secondary | ICD-10-CM | POA: Diagnosis not present

## 2021-07-26 DIAGNOSIS — J432 Centrilobular emphysema: Secondary | ICD-10-CM | POA: Diagnosis not present

## 2021-08-01 DIAGNOSIS — J449 Chronic obstructive pulmonary disease, unspecified: Secondary | ICD-10-CM | POA: Diagnosis not present

## 2021-08-01 DIAGNOSIS — J479 Bronchiectasis, uncomplicated: Secondary | ICD-10-CM | POA: Diagnosis not present

## 2021-08-01 DIAGNOSIS — J9612 Chronic respiratory failure with hypercapnia: Secondary | ICD-10-CM | POA: Diagnosis not present

## 2021-08-08 DIAGNOSIS — J449 Chronic obstructive pulmonary disease, unspecified: Secondary | ICD-10-CM | POA: Diagnosis not present

## 2021-08-12 DIAGNOSIS — T371X5A Adverse effect of antimycobacterial drugs, initial encounter: Secondary | ICD-10-CM | POA: Diagnosis not present

## 2021-08-25 DIAGNOSIS — J860 Pyothorax with fistula: Secondary | ICD-10-CM | POA: Diagnosis not present

## 2021-08-25 DIAGNOSIS — R0689 Other abnormalities of breathing: Secondary | ICD-10-CM | POA: Diagnosis not present

## 2021-08-25 DIAGNOSIS — J441 Chronic obstructive pulmonary disease with (acute) exacerbation: Secondary | ICD-10-CM | POA: Diagnosis not present

## 2021-08-25 DIAGNOSIS — J9622 Acute and chronic respiratory failure with hypercapnia: Secondary | ICD-10-CM | POA: Diagnosis not present

## 2021-08-25 DIAGNOSIS — Z20822 Contact with and (suspected) exposure to covid-19: Secondary | ICD-10-CM | POA: Diagnosis not present

## 2021-08-25 DIAGNOSIS — Z720 Tobacco use: Secondary | ICD-10-CM | POA: Diagnosis not present

## 2021-08-25 DIAGNOSIS — J439 Emphysema, unspecified: Secondary | ICD-10-CM | POA: Diagnosis not present

## 2021-08-25 DIAGNOSIS — R531 Weakness: Secondary | ICD-10-CM | POA: Diagnosis not present

## 2021-08-25 DIAGNOSIS — R9431 Abnormal electrocardiogram [ECG] [EKG]: Secondary | ICD-10-CM | POA: Diagnosis not present

## 2021-08-25 DIAGNOSIS — A310 Pulmonary mycobacterial infection: Secondary | ICD-10-CM | POA: Diagnosis not present

## 2021-08-25 DIAGNOSIS — R0602 Shortness of breath: Secondary | ICD-10-CM | POA: Diagnosis not present

## 2021-08-25 DIAGNOSIS — J9381 Chronic pneumothorax: Secondary | ICD-10-CM | POA: Diagnosis not present

## 2021-08-25 DIAGNOSIS — J479 Bronchiectasis, uncomplicated: Secondary | ICD-10-CM | POA: Diagnosis not present

## 2021-08-25 DIAGNOSIS — R109 Unspecified abdominal pain: Secondary | ICD-10-CM | POA: Diagnosis not present

## 2021-08-25 DIAGNOSIS — J9612 Chronic respiratory failure with hypercapnia: Secondary | ICD-10-CM | POA: Diagnosis not present

## 2021-08-25 DIAGNOSIS — J449 Chronic obstructive pulmonary disease, unspecified: Secondary | ICD-10-CM | POA: Diagnosis not present

## 2021-08-25 DIAGNOSIS — J9601 Acute respiratory failure with hypoxia: Secondary | ICD-10-CM | POA: Diagnosis not present

## 2021-08-25 DIAGNOSIS — I700 Atherosclerosis of aorta: Secondary | ICD-10-CM | POA: Diagnosis not present

## 2021-08-25 DIAGNOSIS — I499 Cardiac arrhythmia, unspecified: Secondary | ICD-10-CM | POA: Diagnosis not present

## 2021-08-25 DIAGNOSIS — I10 Essential (primary) hypertension: Secondary | ICD-10-CM | POA: Diagnosis not present

## 2021-08-25 DIAGNOSIS — R059 Cough, unspecified: Secondary | ICD-10-CM | POA: Diagnosis not present

## 2021-08-25 DIAGNOSIS — R042 Hemoptysis: Secondary | ICD-10-CM | POA: Diagnosis not present

## 2021-08-25 DIAGNOSIS — J9621 Acute and chronic respiratory failure with hypoxia: Secondary | ICD-10-CM | POA: Diagnosis not present

## 2021-08-25 DIAGNOSIS — J189 Pneumonia, unspecified organism: Secondary | ICD-10-CM | POA: Diagnosis not present

## 2021-08-25 DIAGNOSIS — J9602 Acute respiratory failure with hypercapnia: Secondary | ICD-10-CM | POA: Diagnosis not present

## 2021-08-25 DIAGNOSIS — R0902 Hypoxemia: Secondary | ICD-10-CM | POA: Diagnosis not present

## 2021-08-25 DIAGNOSIS — K219 Gastro-esophageal reflux disease without esophagitis: Secondary | ICD-10-CM | POA: Diagnosis not present

## 2021-08-25 DIAGNOSIS — K828 Other specified diseases of gallbladder: Secondary | ICD-10-CM | POA: Diagnosis not present

## 2021-09-08 DIAGNOSIS — J449 Chronic obstructive pulmonary disease, unspecified: Secondary | ICD-10-CM | POA: Diagnosis not present

## 2021-09-23 DIAGNOSIS — M546 Pain in thoracic spine: Secondary | ICD-10-CM | POA: Diagnosis not present

## 2021-09-23 DIAGNOSIS — M5459 Other low back pain: Secondary | ICD-10-CM | POA: Diagnosis not present

## 2021-09-23 DIAGNOSIS — M545 Low back pain, unspecified: Secondary | ICD-10-CM | POA: Diagnosis not present

## 2021-09-23 DIAGNOSIS — Z79891 Long term (current) use of opiate analgesic: Secondary | ICD-10-CM | POA: Diagnosis not present

## 2021-09-23 DIAGNOSIS — G8929 Other chronic pain: Secondary | ICD-10-CM | POA: Diagnosis not present

## 2021-10-10 DIAGNOSIS — A310 Pulmonary mycobacterial infection: Secondary | ICD-10-CM | POA: Diagnosis not present

## 2021-10-10 DIAGNOSIS — A498 Other bacterial infections of unspecified site: Secondary | ICD-10-CM | POA: Diagnosis not present

## 2021-11-05 DIAGNOSIS — Z9181 History of falling: Secondary | ICD-10-CM | POA: Diagnosis not present

## 2021-11-05 DIAGNOSIS — R634 Abnormal weight loss: Secondary | ICD-10-CM | POA: Diagnosis not present

## 2021-11-05 DIAGNOSIS — K219 Gastro-esophageal reflux disease without esophagitis: Secondary | ICD-10-CM | POA: Diagnosis not present

## 2021-11-05 DIAGNOSIS — D649 Anemia, unspecified: Secondary | ICD-10-CM | POA: Diagnosis not present

## 2021-11-05 DIAGNOSIS — Z9889 Other specified postprocedural states: Secondary | ICD-10-CM | POA: Diagnosis not present

## 2021-11-05 DIAGNOSIS — E782 Mixed hyperlipidemia: Secondary | ICD-10-CM | POA: Diagnosis not present

## 2021-11-05 DIAGNOSIS — Z139 Encounter for screening, unspecified: Secondary | ICD-10-CM | POA: Diagnosis not present

## 2021-11-05 DIAGNOSIS — F418 Other specified anxiety disorders: Secondary | ICD-10-CM | POA: Diagnosis not present

## 2021-11-05 DIAGNOSIS — I10 Essential (primary) hypertension: Secondary | ICD-10-CM | POA: Diagnosis not present

## 2021-11-05 DIAGNOSIS — I6529 Occlusion and stenosis of unspecified carotid artery: Secondary | ICD-10-CM | POA: Diagnosis not present

## 2021-11-05 DIAGNOSIS — Z1331 Encounter for screening for depression: Secondary | ICD-10-CM | POA: Diagnosis not present

## 2021-11-05 DIAGNOSIS — D509 Iron deficiency anemia, unspecified: Secondary | ICD-10-CM | POA: Diagnosis not present

## 2021-11-11 DIAGNOSIS — R634 Abnormal weight loss: Secondary | ICD-10-CM | POA: Diagnosis not present

## 2021-11-14 DIAGNOSIS — J441 Chronic obstructive pulmonary disease with (acute) exacerbation: Secondary | ICD-10-CM | POA: Diagnosis not present

## 2021-11-14 DIAGNOSIS — Z681 Body mass index (BMI) 19 or less, adult: Secondary | ICD-10-CM | POA: Diagnosis not present

## 2021-11-14 DIAGNOSIS — R0602 Shortness of breath: Secondary | ICD-10-CM | POA: Diagnosis not present

## 2021-11-14 DIAGNOSIS — J449 Chronic obstructive pulmonary disease, unspecified: Secondary | ICD-10-CM | POA: Diagnosis not present

## 2021-11-23 DIAGNOSIS — J441 Chronic obstructive pulmonary disease with (acute) exacerbation: Secondary | ICD-10-CM | POA: Diagnosis not present

## 2021-11-27 DIAGNOSIS — F419 Anxiety disorder, unspecified: Secondary | ICD-10-CM | POA: Diagnosis not present

## 2021-11-27 DIAGNOSIS — F411 Generalized anxiety disorder: Secondary | ICD-10-CM | POA: Diagnosis not present

## 2021-11-27 DIAGNOSIS — J9621 Acute and chronic respiratory failure with hypoxia: Secondary | ICD-10-CM | POA: Diagnosis not present

## 2021-11-27 DIAGNOSIS — J189 Pneumonia, unspecified organism: Secondary | ICD-10-CM | POA: Diagnosis not present

## 2021-11-27 DIAGNOSIS — K219 Gastro-esophageal reflux disease without esophagitis: Secondary | ICD-10-CM | POA: Diagnosis not present

## 2021-11-27 DIAGNOSIS — I10 Essential (primary) hypertension: Secondary | ICD-10-CM | POA: Diagnosis not present

## 2021-11-27 DIAGNOSIS — J471 Bronchiectasis with (acute) exacerbation: Secondary | ICD-10-CM | POA: Diagnosis not present

## 2021-11-27 DIAGNOSIS — I700 Atherosclerosis of aorta: Secondary | ICD-10-CM | POA: Diagnosis not present

## 2021-11-27 DIAGNOSIS — A4152 Sepsis due to Pseudomonas: Secondary | ICD-10-CM | POA: Diagnosis not present

## 2021-11-27 DIAGNOSIS — A310 Pulmonary mycobacterial infection: Secondary | ICD-10-CM | POA: Diagnosis not present

## 2021-11-27 DIAGNOSIS — E440 Moderate protein-calorie malnutrition: Secondary | ICD-10-CM | POA: Diagnosis not present

## 2021-11-27 DIAGNOSIS — Z20822 Contact with and (suspected) exposure to covid-19: Secondary | ICD-10-CM | POA: Diagnosis not present

## 2021-11-27 DIAGNOSIS — R0689 Other abnormalities of breathing: Secondary | ICD-10-CM | POA: Diagnosis not present

## 2021-11-27 DIAGNOSIS — J929 Pleural plaque without asbestos: Secondary | ICD-10-CM | POA: Diagnosis not present

## 2021-11-27 DIAGNOSIS — A419 Sepsis, unspecified organism: Secondary | ICD-10-CM | POA: Diagnosis not present

## 2021-11-27 DIAGNOSIS — J441 Chronic obstructive pulmonary disease with (acute) exacerbation: Secondary | ICD-10-CM | POA: Diagnosis not present

## 2021-11-27 DIAGNOSIS — F331 Major depressive disorder, recurrent, moderate: Secondary | ICD-10-CM | POA: Diagnosis not present

## 2021-11-27 DIAGNOSIS — J9381 Chronic pneumothorax: Secondary | ICD-10-CM | POA: Diagnosis not present

## 2021-11-27 DIAGNOSIS — J470 Bronchiectasis with acute lower respiratory infection: Secondary | ICD-10-CM | POA: Diagnosis not present

## 2021-11-27 DIAGNOSIS — Z1624 Resistance to multiple antibiotics: Secondary | ICD-10-CM | POA: Diagnosis not present

## 2021-11-27 DIAGNOSIS — J151 Pneumonia due to Pseudomonas: Secondary | ICD-10-CM | POA: Diagnosis not present

## 2021-11-27 DIAGNOSIS — R059 Cough, unspecified: Secondary | ICD-10-CM | POA: Diagnosis not present

## 2021-11-27 DIAGNOSIS — J439 Emphysema, unspecified: Secondary | ICD-10-CM | POA: Diagnosis not present

## 2021-11-28 DIAGNOSIS — J151 Pneumonia due to Pseudomonas: Secondary | ICD-10-CM | POA: Diagnosis not present

## 2021-11-28 DIAGNOSIS — A310 Pulmonary mycobacterial infection: Secondary | ICD-10-CM | POA: Diagnosis not present

## 2021-12-02 DIAGNOSIS — J151 Pneumonia due to Pseudomonas: Secondary | ICD-10-CM | POA: Diagnosis not present

## 2021-12-02 DIAGNOSIS — A310 Pulmonary mycobacterial infection: Secondary | ICD-10-CM | POA: Diagnosis not present

## 2021-12-05 DIAGNOSIS — F411 Generalized anxiety disorder: Secondary | ICD-10-CM | POA: Diagnosis not present

## 2021-12-05 DIAGNOSIS — F331 Major depressive disorder, recurrent, moderate: Secondary | ICD-10-CM | POA: Diagnosis not present

## 2021-12-10 DIAGNOSIS — F411 Generalized anxiety disorder: Secondary | ICD-10-CM | POA: Diagnosis not present

## 2021-12-10 DIAGNOSIS — F331 Major depressive disorder, recurrent, moderate: Secondary | ICD-10-CM | POA: Diagnosis not present

## 2021-12-13 DIAGNOSIS — J151 Pneumonia due to Pseudomonas: Secondary | ICD-10-CM | POA: Diagnosis not present

## 2021-12-13 DIAGNOSIS — Z681 Body mass index (BMI) 19 or less, adult: Secondary | ICD-10-CM | POA: Diagnosis not present

## 2021-12-13 DIAGNOSIS — Z79899 Other long term (current) drug therapy: Secondary | ICD-10-CM | POA: Diagnosis not present

## 2021-12-13 DIAGNOSIS — L03115 Cellulitis of right lower limb: Secondary | ICD-10-CM | POA: Diagnosis not present

## 2021-12-13 DIAGNOSIS — G4700 Insomnia, unspecified: Secondary | ICD-10-CM | POA: Diagnosis not present

## 2021-12-19 DIAGNOSIS — M546 Pain in thoracic spine: Secondary | ICD-10-CM | POA: Diagnosis not present

## 2021-12-19 DIAGNOSIS — A310 Pulmonary mycobacterial infection: Secondary | ICD-10-CM | POA: Diagnosis not present

## 2021-12-19 DIAGNOSIS — G8929 Other chronic pain: Secondary | ICD-10-CM | POA: Diagnosis not present

## 2021-12-19 DIAGNOSIS — M545 Low back pain, unspecified: Secondary | ICD-10-CM | POA: Diagnosis not present

## 2021-12-19 DIAGNOSIS — M62830 Muscle spasm of back: Secondary | ICD-10-CM | POA: Diagnosis not present

## 2021-12-19 DIAGNOSIS — M5416 Radiculopathy, lumbar region: Secondary | ICD-10-CM | POA: Diagnosis not present

## 2021-12-19 DIAGNOSIS — Z79891 Long term (current) use of opiate analgesic: Secondary | ICD-10-CM | POA: Diagnosis not present

## 2021-12-26 DIAGNOSIS — J151 Pneumonia due to Pseudomonas: Secondary | ICD-10-CM | POA: Diagnosis not present

## 2021-12-26 DIAGNOSIS — A498 Other bacterial infections of unspecified site: Secondary | ICD-10-CM | POA: Diagnosis not present

## 2021-12-26 DIAGNOSIS — Z452 Encounter for adjustment and management of vascular access device: Secondary | ICD-10-CM | POA: Diagnosis not present

## 2021-12-26 DIAGNOSIS — A310 Pulmonary mycobacterial infection: Secondary | ICD-10-CM | POA: Diagnosis not present

## 2021-12-26 DIAGNOSIS — J984 Other disorders of lung: Secondary | ICD-10-CM | POA: Diagnosis not present

## 2021-12-27 DIAGNOSIS — J441 Chronic obstructive pulmonary disease with (acute) exacerbation: Secondary | ICD-10-CM | POA: Diagnosis not present

## 2021-12-27 DIAGNOSIS — J151 Pneumonia due to Pseudomonas: Secondary | ICD-10-CM | POA: Diagnosis not present

## 2021-12-27 DIAGNOSIS — J9621 Acute and chronic respiratory failure with hypoxia: Secondary | ICD-10-CM | POA: Diagnosis not present

## 2021-12-27 DIAGNOSIS — A4152 Sepsis due to Pseudomonas: Secondary | ICD-10-CM | POA: Diagnosis not present

## 2021-12-28 DIAGNOSIS — J151 Pneumonia due to Pseudomonas: Secondary | ICD-10-CM | POA: Diagnosis not present

## 2021-12-28 DIAGNOSIS — J441 Chronic obstructive pulmonary disease with (acute) exacerbation: Secondary | ICD-10-CM | POA: Diagnosis not present

## 2021-12-28 DIAGNOSIS — J9621 Acute and chronic respiratory failure with hypoxia: Secondary | ICD-10-CM | POA: Diagnosis not present

## 2021-12-28 DIAGNOSIS — A4152 Sepsis due to Pseudomonas: Secondary | ICD-10-CM | POA: Diagnosis not present

## 2021-12-29 DIAGNOSIS — J441 Chronic obstructive pulmonary disease with (acute) exacerbation: Secondary | ICD-10-CM | POA: Diagnosis not present

## 2021-12-29 DIAGNOSIS — J9621 Acute and chronic respiratory failure with hypoxia: Secondary | ICD-10-CM | POA: Diagnosis not present

## 2021-12-29 DIAGNOSIS — A4152 Sepsis due to Pseudomonas: Secondary | ICD-10-CM | POA: Diagnosis not present

## 2021-12-29 DIAGNOSIS — J151 Pneumonia due to Pseudomonas: Secondary | ICD-10-CM | POA: Diagnosis not present

## 2021-12-30 DIAGNOSIS — Z5181 Encounter for therapeutic drug level monitoring: Secondary | ICD-10-CM | POA: Diagnosis not present

## 2021-12-30 DIAGNOSIS — A4152 Sepsis due to Pseudomonas: Secondary | ICD-10-CM | POA: Diagnosis not present

## 2021-12-30 DIAGNOSIS — J151 Pneumonia due to Pseudomonas: Secondary | ICD-10-CM | POA: Diagnosis not present

## 2021-12-30 DIAGNOSIS — J441 Chronic obstructive pulmonary disease with (acute) exacerbation: Secondary | ICD-10-CM | POA: Diagnosis not present

## 2021-12-30 DIAGNOSIS — J9621 Acute and chronic respiratory failure with hypoxia: Secondary | ICD-10-CM | POA: Diagnosis not present

## 2021-12-31 DIAGNOSIS — A4152 Sepsis due to Pseudomonas: Secondary | ICD-10-CM | POA: Diagnosis not present

## 2021-12-31 DIAGNOSIS — J151 Pneumonia due to Pseudomonas: Secondary | ICD-10-CM | POA: Diagnosis not present

## 2021-12-31 DIAGNOSIS — J441 Chronic obstructive pulmonary disease with (acute) exacerbation: Secondary | ICD-10-CM | POA: Diagnosis not present

## 2021-12-31 DIAGNOSIS — J9621 Acute and chronic respiratory failure with hypoxia: Secondary | ICD-10-CM | POA: Diagnosis not present

## 2022-01-01 DIAGNOSIS — J151 Pneumonia due to Pseudomonas: Secondary | ICD-10-CM | POA: Diagnosis not present

## 2022-01-01 DIAGNOSIS — A4152 Sepsis due to Pseudomonas: Secondary | ICD-10-CM | POA: Diagnosis not present

## 2022-01-01 DIAGNOSIS — J9621 Acute and chronic respiratory failure with hypoxia: Secondary | ICD-10-CM | POA: Diagnosis not present

## 2022-01-01 DIAGNOSIS — J441 Chronic obstructive pulmonary disease with (acute) exacerbation: Secondary | ICD-10-CM | POA: Diagnosis not present

## 2022-01-02 DIAGNOSIS — J441 Chronic obstructive pulmonary disease with (acute) exacerbation: Secondary | ICD-10-CM | POA: Diagnosis not present

## 2022-01-02 DIAGNOSIS — J151 Pneumonia due to Pseudomonas: Secondary | ICD-10-CM | POA: Diagnosis not present

## 2022-01-02 DIAGNOSIS — J9621 Acute and chronic respiratory failure with hypoxia: Secondary | ICD-10-CM | POA: Diagnosis not present

## 2022-01-02 DIAGNOSIS — A4152 Sepsis due to Pseudomonas: Secondary | ICD-10-CM | POA: Diagnosis not present

## 2022-01-03 DIAGNOSIS — J441 Chronic obstructive pulmonary disease with (acute) exacerbation: Secondary | ICD-10-CM | POA: Diagnosis not present

## 2022-01-03 DIAGNOSIS — J151 Pneumonia due to Pseudomonas: Secondary | ICD-10-CM | POA: Diagnosis not present

## 2022-01-03 DIAGNOSIS — J9621 Acute and chronic respiratory failure with hypoxia: Secondary | ICD-10-CM | POA: Diagnosis not present

## 2022-01-03 DIAGNOSIS — A4152 Sepsis due to Pseudomonas: Secondary | ICD-10-CM | POA: Diagnosis not present

## 2022-01-04 DIAGNOSIS — J441 Chronic obstructive pulmonary disease with (acute) exacerbation: Secondary | ICD-10-CM | POA: Diagnosis not present

## 2022-01-04 DIAGNOSIS — J9621 Acute and chronic respiratory failure with hypoxia: Secondary | ICD-10-CM | POA: Diagnosis not present

## 2022-01-04 DIAGNOSIS — J151 Pneumonia due to Pseudomonas: Secondary | ICD-10-CM | POA: Diagnosis not present

## 2022-01-04 DIAGNOSIS — A4152 Sepsis due to Pseudomonas: Secondary | ICD-10-CM | POA: Diagnosis not present

## 2022-01-05 DIAGNOSIS — J9621 Acute and chronic respiratory failure with hypoxia: Secondary | ICD-10-CM | POA: Diagnosis not present

## 2022-01-05 DIAGNOSIS — A4152 Sepsis due to Pseudomonas: Secondary | ICD-10-CM | POA: Diagnosis not present

## 2022-01-05 DIAGNOSIS — J441 Chronic obstructive pulmonary disease with (acute) exacerbation: Secondary | ICD-10-CM | POA: Diagnosis not present

## 2022-01-05 DIAGNOSIS — J151 Pneumonia due to Pseudomonas: Secondary | ICD-10-CM | POA: Diagnosis not present

## 2022-01-06 DIAGNOSIS — J9621 Acute and chronic respiratory failure with hypoxia: Secondary | ICD-10-CM | POA: Diagnosis not present

## 2022-01-06 DIAGNOSIS — A4152 Sepsis due to Pseudomonas: Secondary | ICD-10-CM | POA: Diagnosis not present

## 2022-01-06 DIAGNOSIS — J151 Pneumonia due to Pseudomonas: Secondary | ICD-10-CM | POA: Diagnosis not present

## 2022-01-06 DIAGNOSIS — J441 Chronic obstructive pulmonary disease with (acute) exacerbation: Secondary | ICD-10-CM | POA: Diagnosis not present

## 2022-01-08 DIAGNOSIS — J151 Pneumonia due to Pseudomonas: Secondary | ICD-10-CM | POA: Diagnosis not present

## 2022-01-08 DIAGNOSIS — Z681 Body mass index (BMI) 19 or less, adult: Secondary | ICD-10-CM | POA: Diagnosis not present

## 2022-01-09 DIAGNOSIS — A310 Pulmonary mycobacterial infection: Secondary | ICD-10-CM | POA: Diagnosis not present

## 2022-01-22 DIAGNOSIS — F17210 Nicotine dependence, cigarettes, uncomplicated: Secondary | ICD-10-CM | POA: Diagnosis not present

## 2022-01-22 DIAGNOSIS — J151 Pneumonia due to Pseudomonas: Secondary | ICD-10-CM | POA: Diagnosis not present

## 2022-01-22 DIAGNOSIS — F17200 Nicotine dependence, unspecified, uncomplicated: Secondary | ICD-10-CM | POA: Diagnosis not present

## 2022-01-22 DIAGNOSIS — J9610 Chronic respiratory failure, unspecified whether with hypoxia or hypercapnia: Secondary | ICD-10-CM | POA: Diagnosis not present

## 2022-01-22 DIAGNOSIS — J449 Chronic obstructive pulmonary disease, unspecified: Secondary | ICD-10-CM | POA: Diagnosis not present

## 2022-01-28 DIAGNOSIS — R0600 Dyspnea, unspecified: Secondary | ICD-10-CM | POA: Diagnosis not present

## 2022-01-28 DIAGNOSIS — Z515 Encounter for palliative care: Secondary | ICD-10-CM | POA: Diagnosis not present

## 2022-01-28 DIAGNOSIS — J151 Pneumonia due to Pseudomonas: Secondary | ICD-10-CM | POA: Diagnosis not present

## 2022-01-28 DIAGNOSIS — J9611 Chronic respiratory failure with hypoxia: Secondary | ICD-10-CM | POA: Diagnosis not present

## 2022-01-28 DIAGNOSIS — N400 Enlarged prostate without lower urinary tract symptoms: Secondary | ICD-10-CM | POA: Diagnosis not present

## 2022-01-28 DIAGNOSIS — J439 Emphysema, unspecified: Secondary | ICD-10-CM | POA: Diagnosis not present

## 2022-01-28 DIAGNOSIS — I10 Essential (primary) hypertension: Secondary | ICD-10-CM | POA: Diagnosis not present

## 2022-01-28 DIAGNOSIS — Z66 Do not resuscitate: Secondary | ICD-10-CM | POA: Diagnosis not present

## 2022-01-28 DIAGNOSIS — R062 Wheezing: Secondary | ICD-10-CM | POA: Diagnosis not present

## 2022-01-28 DIAGNOSIS — J189 Pneumonia, unspecified organism: Secondary | ICD-10-CM | POA: Diagnosis not present

## 2022-01-28 DIAGNOSIS — J9610 Chronic respiratory failure, unspecified whether with hypoxia or hypercapnia: Secondary | ICD-10-CM | POA: Diagnosis not present

## 2022-01-28 DIAGNOSIS — Z7189 Other specified counseling: Secondary | ICD-10-CM | POA: Diagnosis not present

## 2022-01-28 DIAGNOSIS — J441 Chronic obstructive pulmonary disease with (acute) exacerbation: Secondary | ICD-10-CM | POA: Diagnosis not present

## 2022-01-28 DIAGNOSIS — Z681 Body mass index (BMI) 19 or less, adult: Secondary | ICD-10-CM | POA: Diagnosis not present

## 2022-01-28 DIAGNOSIS — K5900 Constipation, unspecified: Secondary | ICD-10-CM | POA: Diagnosis not present

## 2022-01-28 DIAGNOSIS — E43 Unspecified severe protein-calorie malnutrition: Secondary | ICD-10-CM | POA: Diagnosis not present

## 2022-01-28 DIAGNOSIS — J948 Other specified pleural conditions: Secondary | ICD-10-CM | POA: Diagnosis not present

## 2022-01-28 DIAGNOSIS — J449 Chronic obstructive pulmonary disease, unspecified: Secondary | ICD-10-CM | POA: Diagnosis not present

## 2022-01-28 DIAGNOSIS — A498 Other bacterial infections of unspecified site: Secondary | ICD-10-CM | POA: Diagnosis not present

## 2022-01-28 DIAGNOSIS — F411 Generalized anxiety disorder: Secondary | ICD-10-CM | POA: Diagnosis not present

## 2022-01-28 DIAGNOSIS — A310 Pulmonary mycobacterial infection: Secondary | ICD-10-CM | POA: Diagnosis not present

## 2022-01-28 DIAGNOSIS — R531 Weakness: Secondary | ICD-10-CM | POA: Diagnosis not present

## 2022-01-29 DIAGNOSIS — A498 Other bacterial infections of unspecified site: Secondary | ICD-10-CM | POA: Diagnosis not present

## 2022-01-29 DIAGNOSIS — A310 Pulmonary mycobacterial infection: Secondary | ICD-10-CM | POA: Diagnosis not present

## 2022-01-29 DIAGNOSIS — J441 Chronic obstructive pulmonary disease with (acute) exacerbation: Secondary | ICD-10-CM | POA: Diagnosis not present

## 2022-01-29 DIAGNOSIS — I10 Essential (primary) hypertension: Secondary | ICD-10-CM | POA: Diagnosis not present

## 2022-01-29 DIAGNOSIS — N400 Enlarged prostate without lower urinary tract symptoms: Secondary | ICD-10-CM | POA: Diagnosis not present

## 2022-01-30 DIAGNOSIS — K5900 Constipation, unspecified: Secondary | ICD-10-CM | POA: Diagnosis not present

## 2022-01-30 DIAGNOSIS — I10 Essential (primary) hypertension: Secondary | ICD-10-CM | POA: Diagnosis not present

## 2022-01-30 DIAGNOSIS — A498 Other bacterial infections of unspecified site: Secondary | ICD-10-CM | POA: Diagnosis not present

## 2022-01-30 DIAGNOSIS — J441 Chronic obstructive pulmonary disease with (acute) exacerbation: Secondary | ICD-10-CM | POA: Diagnosis not present

## 2022-01-31 DIAGNOSIS — J189 Pneumonia, unspecified organism: Secondary | ICD-10-CM | POA: Diagnosis not present

## 2022-01-31 DIAGNOSIS — F411 Generalized anxiety disorder: Secondary | ICD-10-CM | POA: Diagnosis not present

## 2022-01-31 DIAGNOSIS — J441 Chronic obstructive pulmonary disease with (acute) exacerbation: Secondary | ICD-10-CM | POA: Diagnosis not present

## 2022-01-31 DIAGNOSIS — Z7189 Other specified counseling: Secondary | ICD-10-CM | POA: Diagnosis not present

## 2022-01-31 DIAGNOSIS — Z515 Encounter for palliative care: Secondary | ICD-10-CM | POA: Diagnosis not present

## 2022-01-31 DIAGNOSIS — A498 Other bacterial infections of unspecified site: Secondary | ICD-10-CM | POA: Diagnosis not present

## 2022-01-31 DIAGNOSIS — K5900 Constipation, unspecified: Secondary | ICD-10-CM | POA: Diagnosis not present

## 2022-01-31 DIAGNOSIS — J151 Pneumonia due to Pseudomonas: Secondary | ICD-10-CM | POA: Diagnosis not present

## 2022-02-03 DIAGNOSIS — F411 Generalized anxiety disorder: Secondary | ICD-10-CM | POA: Diagnosis not present

## 2022-02-03 DIAGNOSIS — J441 Chronic obstructive pulmonary disease with (acute) exacerbation: Secondary | ICD-10-CM | POA: Diagnosis not present

## 2022-02-03 DIAGNOSIS — Z515 Encounter for palliative care: Secondary | ICD-10-CM | POA: Diagnosis not present

## 2022-02-03 DIAGNOSIS — K5900 Constipation, unspecified: Secondary | ICD-10-CM | POA: Diagnosis not present

## 2022-02-03 DIAGNOSIS — Z7189 Other specified counseling: Secondary | ICD-10-CM | POA: Diagnosis not present

## 2022-02-03 DIAGNOSIS — A498 Other bacterial infections of unspecified site: Secondary | ICD-10-CM | POA: Diagnosis not present

## 2022-02-03 DIAGNOSIS — J151 Pneumonia due to Pseudomonas: Secondary | ICD-10-CM | POA: Diagnosis not present

## 2022-02-03 DIAGNOSIS — J449 Chronic obstructive pulmonary disease, unspecified: Secondary | ICD-10-CM | POA: Diagnosis not present

## 2022-02-03 DIAGNOSIS — J439 Emphysema, unspecified: Secondary | ICD-10-CM | POA: Diagnosis not present

## 2022-02-03 DIAGNOSIS — J9610 Chronic respiratory failure, unspecified whether with hypoxia or hypercapnia: Secondary | ICD-10-CM | POA: Diagnosis not present

## 2022-02-04 DIAGNOSIS — R531 Weakness: Secondary | ICD-10-CM | POA: Diagnosis not present

## 2022-02-04 DIAGNOSIS — J449 Chronic obstructive pulmonary disease, unspecified: Secondary | ICD-10-CM | POA: Diagnosis not present

## 2022-02-04 DIAGNOSIS — J9610 Chronic respiratory failure, unspecified whether with hypoxia or hypercapnia: Secondary | ICD-10-CM | POA: Diagnosis not present

## 2022-02-04 DIAGNOSIS — J151 Pneumonia due to Pseudomonas: Secondary | ICD-10-CM | POA: Diagnosis not present

## 2022-03-03 DEATH — deceased
